# Patient Record
Sex: Male | Born: 1990 | Race: Black or African American | Hispanic: No | Marital: Single
Health system: Southern US, Community
[De-identification: ages and names within clinical notes are randomized; demographics above are authoritative.]

## PROBLEM LIST (undated history)

## (undated) DIAGNOSIS — F1910 Other psychoactive substance abuse, uncomplicated: Secondary | ICD-10-CM

## (undated) DIAGNOSIS — R454 Irritability and anger: Secondary | ICD-10-CM

---
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
## 2013-04-29 ENCOUNTER — Ambulatory Visit: Payer: Self-pay | Admitting: Internal Medicine

## 2013-04-29 ENCOUNTER — Telehealth: Payer: Self-pay | Admitting: Internal Medicine

## 2013-04-29 NOTE — Telephone Encounter (Signed)
Pt's grandmother called. They cannot find pt. They have looked everywhere and no one can locate him for this appt. Pt is depressed. Grandmother very apologetic.

## 2013-04-29 NOTE — Telephone Encounter (Signed)
FYI

## 2014-06-30 ENCOUNTER — Ambulatory Visit (INDEPENDENT_AMBULATORY_CARE_PROVIDER_SITE_OTHER): Payer: 59 | Admitting: Family Medicine

## 2014-06-30 ENCOUNTER — Ambulatory Visit (INDEPENDENT_AMBULATORY_CARE_PROVIDER_SITE_OTHER): Payer: 59

## 2014-06-30 VITALS — BP 120/70 | HR 58 | Temp 98.5°F | Resp 16 | Ht 69.0 in | Wt 123.8 lb

## 2014-06-30 DIAGNOSIS — M545 Low back pain, unspecified: Secondary | ICD-10-CM

## 2014-06-30 DIAGNOSIS — T148XXA Other injury of unspecified body region, initial encounter: Secondary | ICD-10-CM

## 2014-06-30 DIAGNOSIS — S39012A Strain of muscle, fascia and tendon of lower back, initial encounter: Secondary | ICD-10-CM

## 2014-06-30 MED ORDER — CYCLOBENZAPRINE HCL 5 MG PO TABS: 5.0000 mg | ORAL_TABLET | Freq: Three times a day (TID) | ORAL | Status: DC | PRN

## 2014-06-30 MED ORDER — IBUPROFEN 600 MG PO TABS: 600.0000 mg | ORAL_TABLET | Freq: Three times a day (TID) | ORAL | Status: DC | PRN

## 2014-06-30 NOTE — Progress Notes (Signed)
      Chief Complaint:  Chief Complaint  Patient presents with  . Back Pain    back pain for a while--lower back in the center.     HPI: Mark Harvey is a 23 y.o. male who is here for  Midline back pain x several years off and on but now in the last month working at [employer] and lifting boxes and has had worsenign back pain, No piro back injuries ran track and played football. Has no numbness or tingling.  Negative for scoliosis  History reviewed. No pertinent past medical history. History reviewed. No pertinent past surgical history. History   Social History  . Marital Status: Single    Spouse Name: N/A    Number of Children: N/A  . Years of Education: N/A   Social History Main Topics  . Smoking status: Never Smoker   . Smokeless tobacco: Never Used  . Alcohol Use: No  . Drug Use: No  . Sexual Activity: None   Other Topics Concern  . None   Social History Narrative  . None   History reviewed. No pertinent family history. Not on File Prior to Admission medications   Not on File     ROS: The patient denies fevers, chills, night sweats, unintentional weight loss, chest pain, palpitations, wheezing, dyspnea on exertion, nausea, vomiting, abdominal pain, dysuria, hematuria, melena, numbness, weakness, or tingling.   All other systems have been reviewed and were otherwise negative with the exception of those mentioned in the HPI and as above.    PHYSICAL EXAM: Filed Vitals:   06/30/14 1844  BP: 120/70  Pulse: 58  Temp: 98.5 F (36.9 C)  Resp: 16   Filed Vitals:   06/30/14 1844  Height: 5\' 9"  (1.753 m)  Weight: 123 lb 12.8 oz (56.155 kg)   Body mass index is 18.27 kg/(m^2).  General: Alert, no acute distress HEENT:  Normocephalic, atraumatic, oropharynx patent. EOMI, PERRLA Cardiovascular:  Regular rate and rhythm, no rubs murmurs or gallops.  No Carotid bruits, radial pulse intact. No pedal edema.  Respiratory: Clear to auscultation bilaterally.  No  wheezes, rales, or rhonchi.  No cyanosis, no use of accessory musculature GI: No organomegaly, abdomen is soft and non-tender, positive bowel sounds.  No masses. Skin: No rashes. Neurologic: Facial musculature symmetric. Psychiatric: Patient is appropriate throughout our interaction. Lymphatic: No cervical lymphadenopathy Musculoskeletal: Gait intact. + paramsk tenderness  Full ROM 5/5 strength, 2/2 DTRs No saddle anesthesia Straight leg negative Hip and knee exam--normal Neg scoliosis    LABS: No results found for this or any previous visit.   EKG/XRAY:   [facility] Neg for fx or dislocation   ASSESSMENT/PLAN: Encounter Diagnoses  Name Primary?  . Midline low back pain without sciatica Yes  . Sprain and strain    Back manual given , advise to lift with legs and not back He is working as a Airline pilotloader and unloader so most likely due to taht Xrays were normal except did have moderate stool, advise to take otc miralax Ibuprofen and flexeril prn   Gross sideeffects, risk and benefits, and alternatives of medications d/w patient. Patient is aware that all medications have potential sideeffects and we are unable to predict every sideeffect or drug-drug interaction that may occur.  Mark Lamping PHUONG, DO 07/01/2014 3:05 PM

## 2014-06-30 NOTE — Patient Instructions (Signed)
Back Injury Prevention Back injuries can be extremely painful and difficult to heal. After having one back injury, you are much more likely to experience another later on. It is important to learn how to avoid injuring or re-injuring your back. The following tips can help you to prevent a back injury. PHYSICAL FITNESS  Exercise regularly and try to develop good tone in your abdominal muscles. Your abdominal muscles provide a lot of the support needed by your back.  Do aerobic exercises (walking, jogging, biking, swimming) regularly.  Do exercises that increase balance and strength (tai chi, yoga) regularly. This can decrease your risk of falling and injuring your back.  Stretch before and after exercising.  Maintain a healthy weight. The more you weigh, the more stress is placed on your back. For every pound of weight, 10 times that amount of pressure is placed on the back. DIET  Talk to your caregiver about how much calcium and vitamin D you need per day. These nutrients help to prevent weakening of the bones (osteoporosis). Osteoporosis can cause broken (fractured) bones that lead to back pain.  Include good sources of calcium in your diet, such as dairy products, green, leafy vegetables, and products with calcium added (fortified).  Include good sources of vitamin D in your diet, such as milk and foods that are fortified with vitamin D.  Consider taking a nutritional supplement or a multivitamin if needed.  Stop smoking if you smoke. POSTURE  Sit and stand up straight. Avoid leaning forward when you sit or hunching over when you stand.  Choose chairs with good low back (lumbar) support.  If you work at a desk, sit close to your work so you do not need to lean over. Keep your chin tucked in. Keep your neck drawn back and elbows bent at a right angle. Your arms should look like the letter "L."  Sit high and close to the steering wheel when you drive. Add a lumbar support to your car  seat if needed.  Avoid sitting or standing in one position for too long. Take breaks to get up, stretch, and walk around at least once every hour. Take breaks if you are driving for long periods of time.  Sleep on your side with your knees slightly bent, or sleep on your back with a pillow under your knees. Do not sleep on your stomach. LIFTING, TWISTING, AND REACHING  Avoid heavy lifting, especially repetitive lifting. If you must do heavy lifting:  Stretch before lifting.  Work slowly.  Rest between lifts.  Use carts and dollies to move objects when possible.  Make several small trips instead of carrying 1 heavy load.  Ask for help when you need it.  Ask for help when moving big, awkward objects.  Follow these steps when lifting:  Stand with your feet shoulder-width apart.  Get as close to the object as you can. Do not try to pick up heavy objects that are far from your body.  Use handles or lifting straps if they are available.  Bend at your knees. Squat down, but keep your heels off the floor.  Keep your shoulders pulled back, your chin tucked in, and your back straight.  Lift the object slowly, tightening the muscles in your legs, abdomen, and buttocks. Keep the object as close to the center of your body as possible.  When you put a load down, use these same guidelines in reverse.  Do not:  Lift the object above your waist.  Twist at the waist while lifting or carrying a load. Move your feet if you need to turn, not your waist.  Bend over without bending at your knees.  Avoid reaching over your head, across a table, or for an object on a high surface. OTHER TIPS  Avoid wet floors and keep sidewalks clear of ice to prevent falls.  Do not sleep on a mattress that is too soft or too hard.  Keep items that are used frequently within easy reach.  Put heavier objects on shelves at waist level and lighter objects on lower or higher shelves.  Find ways to  decrease your stress, such as exercise, massage, or relaxation techniques. Stress can build up in your muscles. Tense muscles are more vulnerable to injury.  Seek treatment for depression or anxiety if needed. These conditions can increase your risk of developing back pain. SEEK MEDICAL CARE IF:  You injure your back.  You have questions about diet, exercise, or other ways to prevent back injuries. MAKE SURE YOU:  Understand these instructions.  Will watch your condition.  Will get help right away if you are not doing well or get worse. Document Released: 08/24/2004 Document Revised: 10/09/2011 Document Reviewed: 08/28/2011 [facility] Patient Information 2015 [place], [place]. This information is not intended to replace advice given to you by your health care provider. Make sure you discuss any questions you have with your health care provider.

## 2014-07-18 ENCOUNTER — Emergency Department (HOSPITAL_COMMUNITY)
Admission: EM | Admit: 2014-07-18 | Discharge: 2014-07-18 | Disposition: A | Discharge status: Home / Self Care | Payer: 59 | Attending: Emergency Medicine | Admitting: Emergency Medicine

## 2014-07-18 ENCOUNTER — Encounter (HOSPITAL_COMMUNITY): Payer: Self-pay | Admitting: *Deleted

## 2014-07-18 DIAGNOSIS — Y998 Other external cause status: Secondary | ICD-10-CM | POA: Diagnosis not present

## 2014-07-18 DIAGNOSIS — S0181XA Laceration without foreign body of other part of head, initial encounter: Secondary | ICD-10-CM

## 2014-07-18 DIAGNOSIS — Y9389 Activity, other specified: Secondary | ICD-10-CM | POA: Insufficient documentation

## 2014-07-18 DIAGNOSIS — Y9289 Other specified places as the place of occurrence of the external cause: Secondary | ICD-10-CM | POA: Diagnosis not present

## 2014-07-18 DIAGNOSIS — Z23 Encounter for immunization: Secondary | ICD-10-CM | POA: Insufficient documentation

## 2014-07-18 DIAGNOSIS — S01111A Laceration without foreign body of right eyelid and periocular area, initial encounter: Secondary | ICD-10-CM | POA: Diagnosis present

## 2014-07-18 DIAGNOSIS — W208XXA Other cause of strike by thrown, projected or falling object, initial encounter: Secondary | ICD-10-CM | POA: Diagnosis not present

## 2014-07-18 MED ORDER — LIDOCAINE-EPINEPHRINE 2 %-1:100000 IJ SOLN
20.0000 mL | Freq: Once | INTRAMUSCULAR | Status: AC
  Administered 2014-07-18: 1 mL
  Filled 2014-07-18: qty 1

## 2014-07-18 MED ORDER — TETANUS-DIPHTH-ACELL PERTUSSIS 5-2.5-18.5 LF-MCG/0.5 IM SUSP
0.5000 mL | Freq: Once | INTRAMUSCULAR | Status: AC
  Administered 2014-07-18: 0.5 mL via INTRAMUSCULAR
  Filled 2014-07-18: qty 0.5

## 2014-07-18 NOTE — ED Notes (Signed)
Wound cleaned and antibiotic applied.  Post instructions and care given.

## 2014-07-18 NOTE — ED Notes (Signed)
Bed: WTR6 Expected date:  Expected time:  Means of arrival:  Comments: 

## 2014-07-18 NOTE — ED Notes (Signed)
Acuity changed.  Pt needed meds and sutures with post instructions.

## 2014-07-18 NOTE — Discharge Instructions (Signed)
Read the information below.  You may return to the Emergency Department at any time for worsening condition or any new symptoms that concern you.  If you develop redness, swelling, pus draining from the wound, or fevers greater than 100.4, return to the ER immediately for a recheck.     Facial Laceration  A facial laceration is a cut on the face. These injuries can be painful and cause bleeding. Lacerations usually heal quickly, but they need special care to reduce scarring. DIAGNOSIS  Your health care provider will take a medical history, ask for details about how the injury occurred, and examine the wound to determine how deep the cut is. TREATMENT  Some facial lacerations may not require closure. Others may not be able to be closed because of an increased risk of infection. The risk of infection and the chance for successful closure will depend on various factors, including the amount of time since the injury occurred. The wound may be cleaned to help prevent infection. If closure is appropriate, pain medicines may be given if needed. Your health care provider will use stitches (sutures), wound glue (adhesive), or skin adhesive strips to repair the laceration. These tools bring the skin edges together to allow for faster healing and a better cosmetic outcome. If needed, you may also be given a tetanus shot. HOME CARE INSTRUCTIONS  Only take over-the-counter or prescription medicines as directed by your health care provider.  Follow your health care provider's instructions for wound care. These instructions will vary depending on the technique used for closing the wound. For Sutures:  Keep the wound clean and dry.   If you were given a bandage (dressing), you should change it at least once a day. Also change the dressing if it becomes wet or dirty, or as directed by your health care provider.   Wash the wound with soap and water 2 times a day. Rinse the wound off with water to remove all  soap. Pat the wound dry with a clean towel.   After cleaning, apply a thin layer of the antibiotic ointment recommended by your health care provider. This will help prevent infection and keep the dressing from sticking.   You may shower as usual after the first 24 hours. Do not soak the wound in water until the sutures are removed.   Get your sutures removed as directed by your health care provider. With facial lacerations, sutures should usually be taken out after 4-5 days to avoid stitch marks.   Wait a few days after your sutures are removed before applying any makeup. For Skin Adhesive Strips:  Keep the wound clean and dry.   Do not get the skin adhesive strips wet. You may bathe carefully, using caution to keep the wound dry.   If the wound gets wet, pat it dry with a clean towel.   Skin adhesive strips will fall off on their own. You may trim the strips as the wound heals. Do not remove skin adhesive strips that are still stuck to the wound. They will fall off in time.  For Wound Adhesive:  You may briefly wet your wound in the shower or bath. Do not soak or scrub the wound. Do not swim. Avoid periods of heavy sweating until the skin adhesive has fallen off on its own. After showering or bathing, gently pat the wound dry with a clean towel.   Do not apply liquid medicine, cream medicine, ointment medicine, or makeup to your wound while the  skin adhesive is in place. This may loosen the film before your wound is healed.   If a dressing is placed over the wound, be careful not to apply tape directly over the skin adhesive. This may cause the adhesive to be pulled off before the wound is healed.   Avoid prolonged exposure to sunlight or tanning lamps while the skin adhesive is in place.  The skin adhesive will usually remain in place for 5-10 days, then naturally fall off the skin. Do not pick at the adhesive film.  After Healing: Once the wound has healed, cover the  wound with sunscreen during the day for 1 full year. This can help minimize scarring. Exposure to ultraviolet light in the first year will darken the scar. It can take 1-2 years for the scar to lose its redness and to heal completely.  SEEK IMMEDIATE MEDICAL CARE IF:  You have redness, pain, or swelling around the wound.   You see ayellowish-white fluid (pus) coming from the wound.   You have chills or a fever.  MAKE SURE YOU:  Understand these instructions.  Will watch your condition.  Will get help right away if you are not doing well or get worse. Document Released: 08/24/2004 Document Revised: 05/07/2013 Document Reviewed: 02/27/2013 [facility] Patient Information 2015 [place]. This information is not intended to replace advice given to you by your health care provider. Make sure you discuss any questions you have with your health care provider.

## 2014-07-18 NOTE — ED Notes (Signed)
Tetanus education given

## 2014-07-18 NOTE — ED Notes (Signed)
Pt reports pulling attic door down and the door came down too quickly and hit him in the R eyebrow. Bleeding controlled. Dressing applied at home, unable to assess laceration in triage.

## 2014-07-18 NOTE — ED Provider Notes (Addendum)
6:22 PM Patient seen by Dr Adriana Simasook.  I was asked to repair facial laceration with steri strips vs sutures.  The laceration occurred around 4am when patient pulled the attic door down and boxes hit him in the face.  Laceration is approximately 2cm.  I have discussed the pros and cons of sutures vs steri strips with patient and family member and they have opted for sutures.    LACERATION REPAIR Performed by: Donnetta HutchingOOK,Bret Vanessen Authorized byDonnetta Hutching: Beverlee Wilmarth Consent: Verbal consent obtained. Risks and benefits: risks, benefits and alternatives were discussed Consent given by: patient Patient identity confirmed: provided demographic data Prepped and Draped in normal sterile fashion Wound explored  Laceration Location: right face/eyebrow  Laceration Length: 2cm  No Foreign Bodies seen or palpated  Anesthesia: local infiltration  Local anesthetic: lidocaine 2% with epinephrine  Anesthetic total: 1.5 ml  Irrigation method: syringe Amount of cleaning: standard  Skin closure: 5-0 plain gut  Number of sutures: 3  Technique: simple interrupted  Patient tolerance: Patient tolerated the procedure well with no immediate complications.   Discussed findings, treatment, and follow up  with patient.  Pt given return precautions.  Pt verbalizes understanding and agrees with plan.      Trixie Dredgemily West, PA-C 07/18/14 1104  Donnetta HutchingBrian Ranard Harte, MD 07/18/14 1221   Medical screening examination/treatment/procedure(s) were conducted as a shared visit with non-physician practitioner(s) and myself.  I personally evaluated the patient during the encounter.   EKG Interpretation None     2 cm laceration to right face/eyebrow. No neuro deficits. Repair by physician assistant.  Donnetta HutchingBrian Keirsten Matuska, MD 07/28/14 0131   Medical screening examination/treatment/procedure(s) were conducted as a shared visit with non-physician practitioner(s) and myself.  I personally evaluated the patient during the encounter.   EKG  Interpretation None       Donnetta HutchingBrian Amire Leazer, MD 07/28/14 2332  Donnetta HutchingBrian Quadry Kampa, MD 07/29/14 Rickey [id number]

## 2014-07-29 NOTE — ED Provider Notes (Signed)
Chief complaint right eyebrow laceration  History of present illness:  Laceration on right lateral eyebrow after box fell from attic. No loss of consciousness or neurological deficits. No other injuries. No neck pain. Severity is mild.  Past medical history: No chronic medical conditions. No meds or allergies.  Review of systems: All other systems reviewed and negative.  No neuro deficits, extremity pain.  Physical exam: Vital signs reviewed Constitutional:  Well-appearing HEENT:  2.0 cm laceration right lateral eyebrow Neck: Supple Lung: Clear bilaterally Cardiovascular: Heart normal sinus rhythm Abdomen: Soft nontender Skin: Laceration as above. Neuro: Normal motor, sensory, cerebellar Psych:  Normal mood and behavior  Impression: Laceration right eyebrow 2.0 cm  Plan: Surgical repair by physician assistant.  Donnetta HutchingBrian Tonatiuh Mallon, MD 07/29/14 [phone]

## 2014-08-18 NOTE — ED Provider Notes (Signed)
CSN: [id number]     Arrival date & time 07/18/14  [id number] History   First MD Initiated Contact with Patient 07/18/14 [phone]     Chief Complaint  Patient presents with  . Head Laceration     (Consider location/radiation/quality/duration/timing/severity/associated sxs/prior Treatment) HPI..... 2 cm laceration on right eyebrow after a box fell from attic. No other head trauma. No neck trauma. No loss of consciousness or neurological deficits. No other injuries. Severity is mild.  History reviewed. No pertinent past medical history. History reviewed. No pertinent past surgical history. No family history on file. History  Substance Use Topics  . Smoking status: Never Smoker   . Smokeless tobacco: Never Used  . Alcohol Use: No    Review of Systems  All other systems reviewed and are negative.     Allergies  Review of patient's allergies indicates no known allergies.  Home Medications   Prior to Admission medications   Medication Sig Start Date End Date Taking? Authorizing Provider  cyclobenzaprine (FLEXERIL) 5 MG tablet Take 1 tablet (5 mg total) by mouth 3 (three) times daily as needed for muscle spasms. 06/30/14   Thao P Le, DO  ibuprofen (ADVIL,MOTRIN) 600 MG tablet Take 1 tablet (600 mg total) by mouth every 8 (eight) hours as needed. Take with foo, no other NSAIDs. 06/30/14   Thao P Le, DO   BP 129/76 mmHg  Pulse 52  Temp(Src) 97.6 F (36.4 C) (Oral)  Resp 17  SpO2 100% Physical Exam  Constitutional: He is oriented to person, place, and time. He appears well-developed and well-nourished.  HENT:  Head: Normocephalic.  2.0 cm laceration to right lateral eyebrow  Eyes: Conjunctivae and EOM are normal. Pupils are equal, round, and reactive to light.  Neck: Normal range of motion. Neck supple.  Cardiovascular: Normal rate and regular rhythm.   Pulmonary/Chest: Effort normal and breath sounds normal.  Abdominal: Soft. Bowel sounds are normal.  Musculoskeletal: Normal range of  motion.  Neurological: He is alert and oriented to person, place, and time.  Skin: Skin is warm and dry.  Psychiatric: He has a normal mood and affect. His behavior is normal.  Nursing note and vitals reviewed.   ED Course  Procedures (including critical care time) Labs Review Labs Reviewed - No data to display  Imaging Review No results found.   EKG Interpretation None      MDM   Final diagnoses:  Facial laceration, initial encounter    Laceration repair by Physician asst.   No neurological deficits.    Donnetta HutchingBrian Devon Kingdon, MD 08/18/14 2150

## 2015-04-26 ENCOUNTER — Other Ambulatory Visit (HOSPITAL_COMMUNITY): Payer: Self-pay | Admitting: Orthopedic Surgery

## 2015-04-26 DIAGNOSIS — M5440 Lumbago with sciatica, unspecified side: Secondary | ICD-10-CM

## 2015-04-28 ENCOUNTER — Ambulatory Visit (HOSPITAL_COMMUNITY): Admission: RE | Admit: 2015-04-28 | Payer: 59 | Source: Ambulatory Visit

## 2015-05-03 ENCOUNTER — Ambulatory Visit (HOSPITAL_COMMUNITY): Admission: RE | Admit: 2015-05-03 | Payer: 59 | Source: Ambulatory Visit

## 2015-05-17 ENCOUNTER — Ambulatory Visit (HOSPITAL_COMMUNITY): Admission: RE | Admit: 2015-05-17 | Payer: 59 | Source: Ambulatory Visit

## 2015-05-26 ENCOUNTER — Ambulatory Visit (HOSPITAL_COMMUNITY): Admission: RE | Admit: 2015-05-26 | Payer: 59 | Source: Ambulatory Visit

## 2015-12-07 ENCOUNTER — Emergency Department (HOSPITAL_COMMUNITY)
Admission: EM | Admit: 2015-12-07 | Discharge: 2015-12-07 | Disposition: A | Discharge status: Home / Self Care | Payer: 59 | Attending: Emergency Medicine | Admitting: Emergency Medicine

## 2015-12-07 ENCOUNTER — Encounter (HOSPITAL_COMMUNITY): Payer: Self-pay | Admitting: Emergency Medicine

## 2015-12-07 DIAGNOSIS — S39012A Strain of muscle, fascia and tendon of lower back, initial encounter: Secondary | ICD-10-CM | POA: Insufficient documentation

## 2015-12-07 DIAGNOSIS — Y939 Activity, unspecified: Secondary | ICD-10-CM | POA: Diagnosis not present

## 2015-12-07 DIAGNOSIS — Y990 Civilian activity done for income or pay: Secondary | ICD-10-CM | POA: Diagnosis not present

## 2015-12-07 DIAGNOSIS — X509XXA Other and unspecified overexertion or strenuous movements or postures, initial encounter: Secondary | ICD-10-CM | POA: Diagnosis not present

## 2015-12-07 DIAGNOSIS — Y929 Unspecified place or not applicable: Secondary | ICD-10-CM | POA: Diagnosis not present

## 2015-12-07 DIAGNOSIS — Z79899 Other long term (current) drug therapy: Secondary | ICD-10-CM | POA: Insufficient documentation

## 2015-12-07 DIAGNOSIS — M5441 Lumbago with sciatica, right side: Secondary | ICD-10-CM | POA: Diagnosis not present

## 2015-12-07 DIAGNOSIS — M545 Low back pain: Secondary | ICD-10-CM | POA: Diagnosis present

## 2015-12-07 DIAGNOSIS — Z791 Long term (current) use of non-steroidal anti-inflammatories (NSAID): Secondary | ICD-10-CM | POA: Diagnosis not present

## 2015-12-07 DIAGNOSIS — M5431 Sciatica, right side: Secondary | ICD-10-CM

## 2015-12-07 MED ORDER — CYCLOBENZAPRINE HCL 10 MG PO TABS: 10.0000 mg | ORAL_TABLET | Freq: Two times a day (BID) | ORAL | Status: DC | PRN

## 2015-12-07 NOTE — Discharge Instructions (Signed)
Sciatica With Rehab The sciatic nerve runs from the back down the leg and is responsible for sensation and control of the muscles in the back (posterior) side of the thigh, lower leg, and foot. Sciatica is a condition that is characterized by inflammation of this nerve.  SYMPTOMS   Signs of nerve damage, including numbness and/or weakness along the posterior side of the lower extremity.  Pain in the back of the thigh that may also travel down the leg.  Pain that worsens when sitting for long periods of time.  Occasionally, pain in the back or buttock. CAUSES  Inflammation of the sciatic nerve is the cause of sciatica. The inflammation is due to something irritating the nerve. Common sources of irritation include:  Sitting for long periods of time.  Direct trauma to the nerve.  Arthritis of the spine.  Herniated or ruptured disk.  Slipping of the vertebrae (spondylolisthesis).  Pressure from soft tissues, such as muscles or ligament-like tissue (fascia). RISK INCREASES WITH:  Sports that place pressure or stress on the spine (football or weightlifting).  Poor strength and flexibility.  Failure to warm up properly before activity.  Family history of low back pain or disk disorders.  Previous back injury or surgery.  Poor body mechanics, especially when lifting, or poor posture. PREVENTION   Warm up and stretch properly before activity.  Maintain physical fitness:  Strength, flexibility, and endurance.  Cardiovascular fitness.  Learn and use proper technique, especially with posture and lifting. When possible, have coach correct improper technique.  Avoid activities that place stress on the spine. PROGNOSIS If treated properly, then sciatica usually resolves within 6 weeks. However, occasionally surgery is necessary.  RELATED COMPLICATIONS   Permanent nerve damage, including pain, numbness, tingle, or weakness.  Chronic back pain.  Risks of surgery: infection,  bleeding, nerve damage, or damage to surrounding tissues. TREATMENT Treatment initially involves resting from any activities that aggravate your symptoms. The use of ice and medication may help reduce pain and inflammation. The use of strengthening and stretching exercises may help reduce pain with activity. These exercises may be performed at home or with referral to a therapist. A therapist may recommend further treatments, such as transcutaneous electronic nerve stimulation (TENS) or ultrasound. Your caregiver may recommend corticosteroid injections to help reduce inflammation of the sciatic nerve. If symptoms persist despite non-surgical (conservative) treatment, then surgery may be recommended. MEDICATION  If pain medication is necessary, then nonsteroidal anti-inflammatory medications, such as aspirin and ibuprofen, or other minor pain relievers, such as acetaminophen, are often recommended.  Do not take pain medication for 7 days before surgery.  Prescription pain relievers may be given if deemed necessary by your caregiver. Use only as directed and only as much as you need.  Ointments applied to the skin may be helpful.  Corticosteroid injections may be given by your caregiver. These injections should be reserved for the most serious cases, because they may only be given a certain number of times. HEAT AND COLD  Cold treatment (icing) relieves pain and reduces inflammation. Cold treatment should be applied for 10 to 15 minutes every 2 to 3 hours for inflammation and pain and immediately after any activity that aggravates your symptoms. Use ice packs or massage the area with a piece of ice (ice massage).  Heat treatment may be used prior to performing the stretching and strengthening activities prescribed by your caregiver, physical therapist, or athletic trainer. Use a heat pack or soak the injury in warm water.   SEEK MEDICAL CARE IF:  Treatment seems to offer no benefit, or the condition  worsens.  Any medications produce adverse side effects. EXERCISES  RANGE OF MOTION (ROM) AND STRETCHING EXERCISES - Sciatica Most people with sciatic will find that their symptoms worsen with either excessive bending forward (flexion) or arching at the low back (extension). The exercises which will help resolve your symptoms will focus on the opposite motion. Your physician, physical therapist or athletic trainer will help you determine which exercises will be most helpful to resolve your low back pain. Do not complete any exercises without first consulting with your clinician. Discontinue any exercises which worsen your symptoms until you speak to your clinician. If you have pain, numbness or tingling which travels down into your buttocks, leg or foot, the goal of the therapy is for these symptoms to move closer to your back and eventually resolve. Occasionally, these leg symptoms will get better, but your low back pain may worsen; this is typically an indication of progress in your rehabilitation. Be certain to be very alert to any changes in your symptoms and the activities in which you participated in the 24 hours prior to the change. Sharing this information with your clinician will allow him/her to most efficiently treat your condition. These exercises may help you when beginning to rehabilitate your injury. Your symptoms may resolve with or without further involvement from your physician, physical therapist or athletic trainer. While completing these exercises, remember:   Restoring tissue flexibility helps normal motion to return to the joints. This allows healthier, less painful movement and activity.  An effective stretch should be held for at least 30 seconds.  A stretch should never be painful. You should only feel a gentle lengthening or release in the stretched tissue. FLEXION RANGE OF MOTION AND STRETCHING EXERCISES: STRETCH - Flexion, Single Knee to Chest   Lie on a firm bed or floor  with both legs extended in front of you.  Keeping one leg in contact with the floor, bring your opposite knee to your chest. Hold your leg in place by either grabbing behind your thigh or at your knee.  Pull until you feel a gentle stretch in your low back. Hold __________ seconds.  Slowly release your grasp and repeat the exercise with the opposite side. Repeat __________ times. Complete this exercise __________ times per day.  STRETCH - Flexion, Double Knee to Chest  Lie on a firm bed or floor with both legs extended in front of you.  Keeping one leg in contact with the floor, bring your opposite knee to your chest.  Tense your stomach muscles to support your back and then lift your other knee to your chest. Hold your legs in place by either grabbing behind your thighs or at your knees.  Pull both knees toward your chest until you feel a gentle stretch in your low back. Hold __________ seconds.  Tense your stomach muscles and slowly return one leg at a time to the floor. Repeat __________ times. Complete this exercise __________ times per day.  STRETCH - Low Trunk Rotation   Lie on a firm bed or floor. Keeping your legs in front of you, bend your knees so they are both pointed toward the ceiling and your feet are flat on the floor.  Extend your arms out to the side. This will stabilize your upper body by keeping your shoulders in contact with the floor.  Gently and slowly drop both knees together to one side until   you feel a gentle stretch in your low back. Hold for __________ seconds.  Tense your stomach muscles to support your low back as you bring your knees back to the starting position. Repeat the exercise to the other side. Repeat __________ times. Complete this exercise __________ times per day  EXTENSION RANGE OF MOTION AND FLEXIBILITY EXERCISES: STRETCH - Extension, Prone on Elbows  Lie on your stomach on the floor, a bed will be too soft. Place your palms about shoulder  width apart and at the height of your head.  Place your elbows under your shoulders. If this is too painful, stack pillows under your chest.  Allow your body to relax so that your hips drop lower and make contact more completely with the floor.  Hold this position for __________ seconds.  Slowly return to lying flat on the floor. Repeat __________ times. Complete this exercise __________ times per day.  RANGE OF MOTION - Extension, Prone Press Ups  Lie on your stomach on the floor, a bed will be too soft. Place your palms about shoulder width apart and at the height of your head.  Keeping your back as relaxed as possible, slowly straighten your elbows while keeping your hips on the floor. You may adjust the placement of your hands to maximize your comfort. As you gain motion, your hands will come more underneath your shoulders.  Hold this position __________ seconds.  Slowly return to lying flat on the floor. Repeat __________ times. Complete this exercise __________ times per day.  STRENGTHENING EXERCISES - Sciatica  These exercises may help you when beginning to rehabilitate your injury. These exercises should be done near your "sweet spot." This is the neutral, low-back arch, somewhere between fully rounded and fully arched, that is your least painful position. When performed in this safe range of motion, these exercises can be used for people who have either a flexion or extension based injury. These exercises may resolve your symptoms with or without further involvement from your physician, physical therapist or athletic trainer. While completing these exercises, remember:   Muscles can gain both the endurance and the strength needed for everyday activities through controlled exercises.  Complete these exercises as instructed by your physician, physical therapist or athletic trainer. Progress with the resistance and repetition exercises only as your caregiver advises.  You may  experience muscle soreness or fatigue, but the pain or discomfort you are trying to eliminate should never worsen during these exercises. If this pain does worsen, stop and make certain you are following the directions exactly. If the pain is still present after adjustments, discontinue the exercise until you can discuss the trouble with your clinician. STRENGTHENING - Deep Abdominals, Pelvic Tilt   Lie on a firm bed or floor. Keeping your legs in front of you, bend your knees so they are both pointed toward the ceiling and your feet are flat on the floor.  Tense your lower abdominal muscles to press your low back into the floor. This motion will rotate your pelvis so that your tail bone is scooping upwards rather than pointing at your feet or into the floor.  With a gentle tension and even breathing, hold this position for __________ seconds. Repeat __________ times. Complete this exercise __________ times per day.  STRENGTHENING - Abdominals, Crunches   Lie on a firm bed or floor. Keeping your legs in front of you, bend your knees so they are both pointed toward the ceiling and your feet are flat on the   floor. Cross your arms over your chest.  Slightly tip your chin down without bending your neck.  Tense your abdominals and slowly lift your trunk high enough to just clear your shoulder blades. Lifting higher can put excessive stress on the low back and does not further strengthen your abdominal muscles.  Control your return to the starting position. Repeat __________ times. Complete this exercise __________ times per day.  STRENGTHENING - Quadruped, Opposite UE/LE Lift  Assume a hands and knees position on a firm surface. Keep your hands under your shoulders and your knees under your hips. You may place padding under your knees for comfort.  Find your neutral spine and gently tense your abdominal muscles so that you can maintain this position. Your shoulders and hips should form a rectangle  that is parallel with the floor and is not twisted.  Keeping your trunk steady, lift your right hand no higher than your shoulder and then your left leg no higher than your hip. Make sure you are not holding your breath. Hold this position __________ seconds.  Continuing to keep your abdominal muscles tense and your back steady, slowly return to your starting position. Repeat with the opposite arm and leg. Repeat __________ times. Complete this exercise __________ times per day.  STRENGTHENING - Abdominals and Quadriceps, Straight Leg Raise   Lie on a firm bed or floor with both legs extended in front of you.  Keeping one leg in contact with the floor, bend the other knee so that your foot can rest flat on the floor.  Find your neutral spine, and tense your abdominal muscles to maintain your spinal position throughout the exercise.  Slowly lift your straight leg off the floor about 6 inches for a count of 15, making sure to not hold your breath.  Still keeping your neutral spine, slowly lower your leg all the way to the floor. Repeat this exercise with each leg __________ times. Complete this exercise __________ times per day. POSTURE AND BODY MECHANICS CONSIDERATIONS - Sciatica Keeping correct posture when sitting, standing or completing your activities will reduce the stress put on different body tissues, allowing injured tissues a chance to heal and limiting painful experiences. The following are general guidelines for improved posture. Your physician or physical therapist will provide you with any instructions specific to your needs. While reading these guidelines, remember:  The exercises prescribed by your provider will help you have the flexibility and strength to maintain correct postures.  The correct posture provides the optimal environment for your joints to work. All of your joints have less wear and tear when properly supported by a spine with good posture. This means you will  experience a healthier, less painful body.  Correct posture must be practiced with all of your activities, especially prolonged sitting and standing. Correct posture is as important when doing repetitive low-stress activities (typing) as it is when doing a single heavy-load activity (lifting). RESTING POSITIONS Consider which positions are most painful for you when choosing a resting position. If you have pain with flexion-based activities (sitting, bending, stooping, squatting), choose a position that allows you to rest in a less flexed posture. You would want to avoid curling into a fetal position on your side. If your pain worsens with extension-based activities (prolonged standing, working overhead), avoid resting in an extended position such as sleeping on your stomach. Most people will find more comfort when they rest with their spine in a more neutral position, neither too rounded nor too   arched. Lying on a non-sagging bed on your side with a pillow between your knees, or on your back with a pillow under your knees will often provide some relief. Keep in mind, being in any one position for a prolonged period of time, no matter how correct your posture, can still lead to stiffness. PROPER SITTING POSTURE In order to minimize stress and discomfort on your spine, you must sit with correct posture Sitting with good posture should be effortless for a healthy body. Returning to good posture is a gradual process. Many people can work toward this most comfortably by using various supports until they have the flexibility and strength to maintain this posture on their own. When sitting with proper posture, your ears will fall over your shoulders and your shoulders will fall over your hips. You should use the back of the chair to support your upper back. Your low back will be in a neutral position, just slightly arched. You may place a small pillow or folded towel at the base of your low back for support.  When  working at a desk, create an environment that supports good, upright posture. Without extra support, muscles fatigue and lead to excessive strain on joints and other tissues. Keep these recommendations in mind: CHAIR:   A chair should be able to slide under your desk when your back makes contact with the back of the chair. This allows you to work closely.  The chair's height should allow your eyes to be level with the upper part of your monitor and your hands to be slightly lower than your elbows. BODY POSITION  Your feet should make contact with the floor. If this is not possible, use a foot rest.  Keep your ears over your shoulders. This will reduce stress on your neck and low back. INCORRECT SITTING POSTURES   If you are feeling tired and unable to assume a healthy sitting posture, do not slouch or slump. This puts excessive strain on your back tissues, causing more damage and pain. Healthier options include:  Using more support, like a lumbar pillow.  Switching tasks to something that requires you to be upright or walking.  Talking a brief walk.  Lying down to rest in a neutral-spine position. PROLONGED STANDING WHILE SLIGHTLY LEANING FORWARD  When completing a task that requires you to lean forward while standing in one place for a long time, place either foot up on a stationary 2-4 inch high object to help maintain the best posture. When both feet are on the ground, the low back tends to lose its slight inward curve. If this curve flattens (or becomes too large), then the back and your other joints will experience too much stress, fatigue more quickly and can cause pain.  CORRECT STANDING POSTURES Proper standing posture should be assumed with all daily activities, even if they only take a few moments, like when brushing your teeth. As in sitting, your ears should fall over your shoulders and your shoulders should fall over your hips. You should keep a slight tension in your abdominal  muscles to brace your spine. Your tailbone should point down to the ground, not behind your body, resulting in an over-extended swayback posture.  INCORRECT STANDING POSTURES  Common incorrect standing postures include a forward head, locked knees and/or an excessive swayback. WALKING Walk with an upright posture. Your ears, shoulders and hips should all line-up. PROLONGED ACTIVITY IN A FLEXED POSITION When completing a task that requires you to bend forward   at your waist or lean over a low surface, try to find a way to stabilize 3 of 4 of your limbs. You can place a hand or elbow on your thigh or rest a knee on the surface you are reaching across. This will provide you more stability so that your muscles do not fatigue as quickly. By keeping your knees relaxed, or slightly bent, you will also reduce stress across your low back. CORRECT LIFTING TECHNIQUES DO :   Assume a wide stance. This will provide you more stability and the opportunity to get as close as possible to the object which you are lifting.  Tense your abdominals to brace your spine; then bend at the knees and hips. Keeping your back locked in a neutral-spine position, lift using your leg muscles. Lift with your legs, keeping your back straight.  Test the weight of unknown objects before attempting to lift them.  Try to keep your elbows locked down at your sides in order get the best strength from your shoulders when carrying an object.  Always ask for help when lifting heavy or awkward objects. INCORRECT LIFTING TECHNIQUES DO NOT:   Lock your knees when lifting, even if it is a small object.  Bend and twist. Pivot at your feet or move your feet when needing to change directions.  Assume that you cannot safely pick up a paperclip without proper posture.   This information is not intended to replace advice given to you by your health care provider. Make sure you discuss any questions you have with your health care provider.     Document Released: 07/17/2005 Document Revised: 12/01/2014 Document Reviewed: 10/29/2008 [employer] Interactive Patient Education 2016 [employer]  

## 2015-12-07 NOTE — ED Notes (Signed)
Pt c/o exacerbation onset today of chronic back pain onset more than 1 year ago. Ambulatory. No bowel or bladder incontinence.

## 2015-12-07 NOTE — ED Provider Notes (Signed)
CSN: [id number]     Arrival date & time 12/07/15  1745 History  By signing my name below, I, Soijett Blue, attest that this documentation has been prepared under the direction and in the presence of Roxy Horsemanobert Geddy Boydstun, PA-C Electronically Signed: Soijett Blue, ED Scribe. 12/07/2015. 6:29 PM.   Chief Complaint  Patient presents with  . Back Pain      The history is provided by the patient. No language interpreter was used.    HPI Comments: Mark Harvey is a 25 y.o. male who presents to the Emergency Department complaining of chronic back pain exacerbation onset today. Pt notes that he has had lower back pain for several years. Pt states that this morning, he was having difficulty with getting up due to his lower back pain. Pt reports that he works at [employer] and he worked hard last night. Pt notes that his lower back pain is worsened with extended periods of standing or working excessively. He states that his lower back pain radiates down his right upper leg. He states that he has not tried any medications for the relief for his symptoms. Pt denies bowel/bladder incontinence, numbness, tingling, gait problem, and any other symptoms. Denies PMHx of CA or IV drug use. Pt mother is going to set up an appointment with [facility] and would like a referral.   History reviewed. No pertinent past medical history. History reviewed. No pertinent past surgical history. History reviewed. No pertinent family history. Social History  Substance Use Topics  . Smoking status: Never Smoker   . Smokeless tobacco: Never Used  . Alcohol Use: No    Review of Systems  Gastrointestinal:       No bowel incontinence  Genitourinary:       No bladder incontinence  Musculoskeletal: Positive for back pain. Negative for gait problem.  Neurological: Negative for numbness.       No tingling  All other systems reviewed and are negative.    Allergies  Review of patient's allergies indicates no known  allergies.  Home Medications   Prior to Admission medications   Medication Sig Start Date End Date Taking? Authorizing Provider  cyclobenzaprine (FLEXERIL) 5 MG tablet Take 1 tablet (5 mg total) by mouth 3 (three) times daily as needed for muscle spasms. 06/30/14   Thao P Le, DO  ibuprofen (ADVIL,MOTRIN) 600 MG tablet Take 1 tablet (600 mg total) by mouth every 8 (eight) hours as needed. Take with foo, no other NSAIDs. 06/30/14   Thao P Le, DO   BP 127/69 mmHg  Pulse 76  Temp(Src) 99 F (37.2 C) (Oral)  Resp 16  SpO2 100% Physical Exam  Physical Exam  Constitutional: Pt appears well-developed and well-nourished. No distress.  HENT:  Head: Normocephalic and atraumatic.  Mouth/Throat: Oropharynx is clear and moist. No oropharyngeal exudate.  Eyes: Conjunctivae are normal.  Neck: Normal range of motion. Neck supple.  No meningismus Cardiovascular: Normal rate, regular rhythm and intact distal pulses.   Pulmonary/Chest: Effort normal and breath sounds normal. No respiratory distress. Pt has no wheezes.  Abdominal: Pt exhibits no distension Musculoskeletal:  Lumbar paraspinal tender to palpation, no bony CTLS spine tenderness, deformity, step-off, or crepitus Lymphadenopathy: Pt has no cervical adenopathy.  Neurological: Pt is alert and oriented Speech is clear and goal oriented, follows commands Normal 5/5 strength in upper and lower extremities bilaterally including dorsiflexion and plantar flexion, strong and equal grip strength Sensation intact Great toe extension intact Moves extremities without ataxia, coordination intact Ankle  and knee jerk reflexes intact and symmetrical  Normal gait Normal balance No Clonus Skin: Skin is warm and dry. No rash noted. Pt is not diaphoretic. No erythema.  Psychiatric: Pt has a normal mood and affect. Behavior is normal.  Nursing note and vitals reviewed.   ED Course  Procedures (including critical care time) DIAGNOSTIC STUDIES: Oxygen  Saturation is 100% on RA, nl by my interpretation.    COORDINATION OF CARE: 6:11 PM Discussed treatment plan with pt at bedside which includes muscle relaxer Rx and pt agreed to plan.     MDM   Final diagnoses:  Lumbar strain, initial encounter  Sciatica of right side    Patient with back pain. No neurological deficits and normal neuro exam.  Patient is ambulatory.  No loss of bowel or bladder control.  No concern for cauda equina.  No fever, night sweats, weight loss, h/o cancer, IVDA, no recent procedure to back. No urinary symptoms suggestive of UTI. Pt will be [facility] for follow up PRN. Pt will be discharged home with flexeril. Supportive care and return precaution discussed. Appears safe for discharge at this time. Follow up as indicated in discharge paperwork.   I personally performed the services described in this documentation, which was scribed in my presence. The recorded information has been reviewed and is accurate.      Roxy Horseman, PA-C 12/07/15 1843  Pricilla Loveless, MD 12/08/15 [phone]

## 2016-07-19 ENCOUNTER — Encounter (HOSPITAL_COMMUNITY): Payer: Self-pay | Admitting: Emergency Medicine

## 2016-07-19 ENCOUNTER — Emergency Department (HOSPITAL_COMMUNITY)
Admission: EM | Admit: 2016-07-19 | Discharge: 2016-07-19 | Disposition: A | Discharge status: Home / Self Care | Payer: 59 | Attending: Emergency Medicine | Admitting: Emergency Medicine

## 2016-07-19 DIAGNOSIS — Z79899 Other long term (current) drug therapy: Secondary | ICD-10-CM | POA: Diagnosis not present

## 2016-07-19 DIAGNOSIS — M545 Low back pain, unspecified: Secondary | ICD-10-CM

## 2016-07-19 MED ORDER — METHOCARBAMOL 500 MG PO TABS
500.0000 mg | ORAL_TABLET | Freq: Two times a day (BID) | ORAL | 0 refills | Status: DC
Start: 2016-07-19 — End: 2017-04-01

## 2016-07-19 MED ORDER — NAPROXEN 500 MG PO TABS: 500.0000 mg | ORAL_TABLET | Freq: Two times a day (BID) | ORAL | 0 refills | Status: DC

## 2016-07-19 NOTE — ED Triage Notes (Signed)
Pt reports 3 day hx of low back pain. Increased pain with activity. Hx of intermittent back pain x 2 years.

## 2016-07-19 NOTE — ED Triage Notes (Signed)
Pt c/o lower back pain for past 3 days. Has tried ibuprofen with no relief. Pt ambulatory.

## 2016-07-19 NOTE — Discharge Instructions (Signed)
Take your medications as prescribed as needed for pain relief. He may also apply ice and/or heat to affected area for 15-20 minutes 3-4 times daily for additional relief. I recommend refrain from doing any heavy lifting or repetitive movements that exacerbate her symptoms for the next few days. Follow-up with your primary care provider if your symptoms have not improved in the next week. Please return to the Emergency Department if symptoms worsen or new onset of fever, numbness, tingling, groin anesthesia, loss of bowel or bladder, weakness.

## 2016-07-19 NOTE — ED Provider Notes (Signed)
[facility]-EMERGENCY DEPT Provider Note   CSN: [id number] Arrival date & time: 07/19/16  1455  By signing my name below, I, Teofilo PodMatthew P. Jamison, attest that this documentation has been prepared under the direction and in the presence of Melburn HakeNicole Jocabed Cheese, [place]-C. Electronically Signed: Teofilo PodMatthew P. Jamison, ED Scribe. 07/19/2016. 3:37 PM.   History   Chief Complaint Chief Complaint  Patient presents with  . Back Pain   The history is provided by the patient. No language interpreter was used.   HPI Comments:  Mark Sensordrian Hebner is a 25 y.o. male who presents to the Emergency Department complaining of chronic back pain for 2 years. Pt states that he first started having back pain when he worked for [employer], and now the back pain has been worsening over the past 3 days. Denies any recent fall, trauma or injury to attribute to this episode of back pain. Pt notes that the pain is worsened when sitting down and bending, and that the pain radiates to his right leg with numbnessand left upper back when he sits down. Pt states that the pain is alleviated saying on his side with a pillow. \Pt was seen here for the same several months ago. Pt has been to the chiropractor 1 year ago and had an x-ray at that time. Pt has taken ibuprofen with no relief. Pt denies injury/trauma, and denies IV drug use Pt denies fever, chest pain, SOB, nausea, vomiting, abdominal pain, bowel/bladder incontinence.    History reviewed. No pertinent past medical history.  There are no active problems to display for this patient.   History reviewed. No pertinent surgical history.     Home Medications    Prior to Admission medications   Medication Sig Start Date End Date Taking? Authorizing Provider  cyclobenzaprine (FLEXERIL) 10 MG tablet Take 1 tablet (10 mg total) by mouth 2 (two) times daily as needed for muscle spasms. 12/07/15   Roxy Horsemanobert Browning, PA-C  methocarbamol (ROBAXIN) 500 MG tablet Take 1 tablet (500 mg total) by mouth 2  (two) times daily. 07/19/16   Barrett HenleNicole Elizabeth Desteni Piscopo, PA-C  naproxen (NAPROSYN) 500 MG tablet Take 1 tablet (500 mg total) by mouth 2 (two) times daily. 07/19/16   Barrett HenleNicole Elizabeth Zuma Hust, PA-C    Family History No family history on file.  Social History Social History  Substance Use Topics  . Smoking status: Never Smoker  . Smokeless tobacco: Never Used  . Alcohol use No     Allergies   Patient has no known allergies.   Review of Systems Review of Systems  Constitutional: Negative for fever.  Respiratory: Negative for shortness of breath.   Cardiovascular: Negative for chest pain.  Gastrointestinal: Negative for abdominal pain and vomiting.  Musculoskeletal: Positive for back pain.     Physical Exam Updated Vital Signs BP 135/85 (BP Location: Right Arm)   Pulse 70   Temp 98.3 F (36.8 C) (Oral)   Resp 16   SpO2 100%   Physical Exam  Constitutional: He is oriented to person, place, and time. He appears well-developed and well-nourished.  HENT:  Head: Normocephalic and atraumatic.  Eyes: Conjunctivae and EOM are normal. Right eye exhibits no discharge. Left eye exhibits no discharge. No scleral icterus.  Neck: Normal range of motion. Neck supple.  Cardiovascular: Normal rate, regular rhythm, normal heart sounds and intact distal pulses.   Pulmonary/Chest: Effort normal and breath sounds normal. No respiratory distress. He has no wheezes. He has no rales. He exhibits no tenderness.  Abdominal: Soft.  Bowel sounds are normal. He exhibits no distension and no mass. There is no tenderness. There is no rebound and no guarding. No hernia.  Musculoskeletal: He exhibits no edema.  No midline C, T, or L tenderness. Mild TTP over right lumbar paraspinal muscles. Negative straight leg raise bilaterally. Full range of motion of neck and back. Full range of motion of bilateral upper and lower extremities, with 5/5 strength. Sensation intact. 2+ radial and PT pulses. Cap refill <2  seconds. Patient able to stand and ambulate without assistance.    Neurological: He is alert and oriented to person, place, and time. He has normal strength. He displays normal reflexes. No sensory deficit. Gait normal.  Skin: Skin is warm and dry.  Nursing note and vitals reviewed.    ED Treatments / Results  DIAGNOSTIC STUDIES:  Oxygen Saturation is 100% on RA, normal by my interpretation.    COORDINATION OF CARE:  3:37 PM Discussed treatment plan with pt at bedside and pt agreed to plan.   Labs (all labs ordered are listed, but only abnormal results are displayed) Labs Reviewed - No data to display  EKG  EKG Interpretation None       Radiology No results found.  Procedures Procedures (including critical care time)  Medications Ordered in ED Medications - No data to display   Initial Impression / Assessment and Plan / ED Course  I have reviewed the triage vital signs and the nursing notes.  Pertinent labs & imaging results that were available during my care of the patient were reviewed by me and considered in my medical decision making (see chart for details).  Clinical Course     Patient with back pain.  No neurological deficits and normal neuro exam.  Patient can walk. No midline spinal tenderness.  No loss of bowel or bladder control.  No concern for cauda equina.  No fever, night sweats, weight loss, h/o cancer, IVDU.  Plan discharge patient home with NSAIDs, muscle relaxant and symptomatically treatment. Advised patient to follow up with PCP as needed. Discussed return precautions.   Final Clinical Impressions(s) / ED Diagnoses   Final diagnoses:  Acute right-sided low back pain without sciatica    New Prescriptions Discharge Medication List as of 07/19/2016  3:57 PM    START taking these medications   Details  methocarbamol (ROBAXIN) 500 MG tablet Take 1 tablet (500 mg total) by mouth 2 (two) times daily., Starting Wed 07/19/2016, Print    naproxen  (NAPROSYN) 500 MG tablet Take 1 tablet (500 mg total) by mouth 2 (two) times daily., Starting Wed 07/19/2016, Print      I personally performed the services described in this documentation, which was scribed in my presence. The recorded information has been reviewed and is accurate.     Satira Sarkicole Elizabeth SartellNadeau, [place]-C 07/19/16 1630    Lorre NickAnthony Allen, MD 07/19/16 [id number]

## 2017-04-01 ENCOUNTER — Encounter (HOSPITAL_COMMUNITY): Payer: Self-pay | Admitting: *Deleted

## 2017-04-01 ENCOUNTER — Emergency Department (HOSPITAL_COMMUNITY)
Admission: EM | Admit: 2017-04-01 | Discharge: 2017-04-01 | Disposition: A | Discharge status: Home / Self Care | Payer: 59 | Attending: Emergency Medicine | Admitting: Emergency Medicine

## 2017-04-01 DIAGNOSIS — Z91010 Allergy to peanuts: Secondary | ICD-10-CM | POA: Diagnosis not present

## 2017-04-01 DIAGNOSIS — M545 Low back pain: Secondary | ICD-10-CM | POA: Diagnosis not present

## 2017-04-01 DIAGNOSIS — M5431 Sciatica, right side: Secondary | ICD-10-CM | POA: Insufficient documentation

## 2017-04-01 DIAGNOSIS — M5441 Lumbago with sciatica, right side: Secondary | ICD-10-CM | POA: Diagnosis not present

## 2017-04-01 MED ORDER — DICLOFENAC SODIUM 50 MG PO TBEC: 50.0000 mg | DELAYED_RELEASE_TABLET | Freq: Two times a day (BID) | ORAL | 0 refills | Status: AC

## 2017-04-01 MED ORDER — CYCLOBENZAPRINE HCL 10 MG PO TABS: 10.0000 mg | ORAL_TABLET | Freq: Two times a day (BID) | ORAL | 0 refills | Status: AC | PRN

## 2017-04-01 NOTE — ED Provider Notes (Signed)
[facility]-EMERGENCY DEPT Provider Note   CSN: [id number] Arrival date & time: 04/01/17  1113     History   Chief Complaint Chief Complaint  Patient presents with  . Back Pain    HPI Mark Harvey is a 26 y.o. male who presents to the ED with back pain. The patient reports that the pain is in the lower back. The pain started 3 days ago and he used heat. 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 Patient reports that when he went home that day he did stretching exercises and when he work the next morning his back pain was worse. He rates his pain as 8/10. Patient reports having back problems in the past and this feels similar.  The history is provided by the patient. No language interpreter was used.  Back Pain   This is a new problem. The current episode started more than 2 days ago. The problem occurs constantly. The problem has been gradually worsening. The pain is associated with no known injury. The pain is present in the lumbar spine. Quality: sharp. The pain radiates to the right thigh. The pain is at a severity of 8/10. The symptoms are aggravated by bending, twisting and certain positions. The pain is the same all the time. Pertinent negatives include no fever, no weight loss, no headaches, no abdominal pain, no bowel incontinence, no bladder incontinence and no dysuria. He has tried heat for the symptoms. The treatment provided mild relief.    History reviewed. No pertinent past medical history.  There are no active problems to display for this patient.   History reviewed. No pertinent surgical history.     Home Medications    Prior to Admission medications   Medication Sig Start Date End Date Taking? Authorizing Provider  cyclobenzaprine (FLEXERIL) 10 MG tablet Take 1 tablet (10 mg total) by mouth 2 (two) times daily as needed for muscle spasms. 04/01/17   Janne Napoleon, NP  diclofenac (VOLTAREN) 50 MG EC  tablet Take 1 tablet (50 mg total) by mouth 2 (two) times daily. 04/01/17   Janne Napoleon, NP    Family History History reviewed. No pertinent family history.  Social History Social History  Substance Use Topics  . Smoking status: Never Smoker  . Smokeless tobacco: Never Used  . Alcohol use No     Allergies   Peanut butter flavor   Review of Systems Review of Systems  Constitutional: Negative for chills, fever and weight loss.  HENT: Negative.   Eyes: Negative for visual disturbance.  Gastrointestinal: Negative for abdominal pain, bowel incontinence, nausea and vomiting.  Genitourinary: Negative for bladder incontinence, dysuria and frequency.  Musculoskeletal: Positive for back pain. Negative for neck pain.  Skin: Negative for rash and wound.  Neurological: Negative for syncope and headaches.  Psychiatric/Behavioral: Negative for confusion. The patient is not nervous/anxious.      Physical Exam Updated Vital Signs BP 134/79 (BP Location: Left Arm)   Pulse 60   Temp 98.5 F (36.9 C) (Oral)   Resp 18   Ht 5\' 10"  (1.778 m)   Wt 68 kg (150 lb)   SpO2 100%   BMI 21.52 kg/m   Physical Exam  Constitutional: He appears well-developed and well-nourished. No distress.  HENT:  Head: Normocephalic and atraumatic.  Mouth/Throat: Mucous membranes are normal.  Eyes: Pupils are equal, round, and reactive to light. EOM are normal.  Neck: Normal range of motion. Neck supple.  Cardiovascular: Normal rate and regular rhythm.   Pulmonary/Chest: Effort normal and breath sounds normal.  Abdominal: Soft. Bowel sounds are normal. There is no tenderness.  Musculoskeletal: Normal range of motion. He exhibits no edema.       Lumbar back: He exhibits tenderness and spasm. He exhibits no deformity and normal pulse.       Back:  Neurological: He is alert. He has normal strength. No sensory deficit. He displays a negative Romberg sign. Gait normal.  Reflex Scores:      Bicep reflexes are  2+ on the right side and 2+ on the left side.      Brachioradialis reflexes are 2+ on the right side and 2+ on the left side.      Patellar reflexes are 2+ on the right side and 2+ on the left side. Skin: Skin is warm and dry.  Psychiatric: He has a normal mood and affect. His behavior is normal.  Nursing note and vitals reviewed.    ED Treatments / Results  Labs (all labs ordered are listed, but only abnormal results are displayed) Labs Reviewed - No data to display Radiology No results found.  Procedures Procedures (including critical care time)  Medications Ordered in ED Medications - No data to display   Initial Impression / Assessment and Plan / ED Course  I have reviewed the triage vital signs and the nursing notes.  Patient with back pain.  No neurological deficits and normal neuro exam.  Patient can walk but states is painful.  No loss of bowel or bladder control.  No concern for cauda equina.  No fever, night sweats, weight loss, h/o cancer, IVDU.  RICE protocol and pain medicine indicated and discussed with patient.   Final Clinical Impressions(s) / ED Diagnoses   Final diagnoses:  Sciatica, right side    New Prescriptions New Prescriptions   CYCLOBENZAPRINE (FLEXERIL) 10 MG TABLET    Take 1 tablet (10 mg total) by mouth 2 (two) times daily as needed for muscle spasms.   DICLOFENAC (VOLTAREN) 50 MG EC TABLET    Take 1 tablet (50 mg total) by mouth 2 (two) times daily.     Kerrie Buffaloeese, Charlton Boule W[place] SpringsM, [place]NP 04/01/17 1551    Doug SouJacubowitz, Sam, MD 04/01/17 1728

## 2017-04-01 NOTE — Discharge Instructions (Signed)
Do not take the muscle relaxant if driving as it will make you sleepy. Follow up with your regular doctor in the next few days. Return here for worsening symptoms.

## 2017-04-01 NOTE — ED Triage Notes (Signed)
Patient is alert and oriented x4.  He is being seen for lower back pain.  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  patient adds that he went home to do stretches and when he woke up the next morning that his back hurt worse.  Currently he rates his pain 8 of 10

## 2018-08-22 ENCOUNTER — Encounter (HOSPITAL_COMMUNITY): Payer: Self-pay

## 2018-08-22 ENCOUNTER — Emergency Department (HOSPITAL_COMMUNITY)
Admission: EM | Admit: 2018-08-22 | Discharge: 2018-08-22 | Disposition: A | Discharge status: Home / Self Care | Payer: Self-pay | Attending: Emergency Medicine | Admitting: Emergency Medicine

## 2018-08-22 ENCOUNTER — Emergency Department (HOSPITAL_COMMUNITY): Payer: Self-pay

## 2018-08-22 DIAGNOSIS — R69 Illness, unspecified: Secondary | ICD-10-CM

## 2018-08-22 DIAGNOSIS — Z79899 Other long term (current) drug therapy: Secondary | ICD-10-CM | POA: Insufficient documentation

## 2018-08-22 DIAGNOSIS — Z91010 Allergy to peanuts: Secondary | ICD-10-CM | POA: Insufficient documentation

## 2018-08-22 DIAGNOSIS — J111 Influenza due to unidentified influenza virus with other respiratory manifestations: Secondary | ICD-10-CM | POA: Insufficient documentation

## 2018-08-22 NOTE — Discharge Instructions (Addendum)
Today your x-rays did not show any pneumonia or other abnormality.    Today your symptoms are consistent with a flu or a flulike illness.  As I did not test you for the flu we call this a flulike illness.  I have given you information to read on the flu as most of this still applies.  If you have not already, please consider getting a flu shot once you are well.  Please make sure to practice good hand hygiene to help prevent the spread of flu.  We discussed today that many symptoms of the flu such as fever, not feeling well, and body aches can also be the first signs of more serious illnesses or infections.  If you have any new or worsening symptoms please seek additional medical care and evaluation.

## 2018-08-22 NOTE — ED Provider Notes (Signed)
[facility]-EMERGENCY DEPT Provider Note   CSN: [id number] Arrival date & time: 08/22/18  1425     History   Chief Complaint Chief Complaint  Patient presents with  . Generalized Body Aches  . Cough    HPI Mark Harvey is a 28 y.o. male who presents today for evaluation of body aches since last Friday.  He reports feeling generally weak and like he has the flu.  He denies any known sick contacts.  He reports fever, chills, headache, cough that is occasionally productive.  He vomited twice two days ago but has not had any nausea or repeat emesis since.  He reports that he thinks he vomited due to coughing.    He denies any abdominal pain, chest pain, or shortness of breath.    HPI  History reviewed. No pertinent past medical history.  There are no active problems to display for this patient.   History reviewed. No pertinent surgical history.      Home Medications    Prior to Admission medications   Medication Sig Start Date End Date Taking? Authorizing Provider  Acetaminophen (TYLENOL PO) Take 2 tablets by mouth daily as needed (pain).   Yes [provider]  Multiple Vitamins-Minerals (MEGA MULTI MEN PO) Take 1 tablet by mouth daily.   Yes [provider]  cyclobenzaprine (FLEXERIL) 10 MG tablet Take 1 tablet (10 mg total) by mouth 2 (two) times daily as needed for muscle spasms. Patient not taking: Reported on 08/22/2018 04/01/17   Janne Napoleon, NP  diclofenac (VOLTAREN) 50 MG EC tablet Take 1 tablet (50 mg total) by mouth 2 (two) times daily. Patient not taking: Reported on 08/22/2018 04/01/17   Janne Napoleon, NP    Family History History reviewed. No pertinent family history.  Social History Social History   Tobacco Use  . Smoking status: Never Smoker  . Smokeless tobacco: Never Used  Substance Use Topics  . Alcohol use: No    Alcohol/week: 0.0 standard drinks  . Drug use: No     Allergies   Peanut butter  flavor   Review of Systems Review of Systems  Constitutional: Positive for chills and fever.  HENT: Negative for congestion, facial swelling and sinus pain.   Respiratory: Positive for cough. Negative for shortness of breath.   Cardiovascular: Negative for chest pain.  Gastrointestinal: Positive for vomiting. Negative for abdominal pain, constipation, diarrhea and nausea.  Musculoskeletal: Positive for back pain. Negative for neck pain and neck stiffness.  Neurological: Negative for weakness.  All other systems reviewed and are negative.    Physical Exam Updated Vital Signs BP 139/79 (BP Location: Right Arm)   Pulse 79   Temp 98.1 F (36.7 C) (Oral)   Resp 18   SpO2 99%   Physical Exam Vitals signs and nursing note reviewed.  Constitutional:      General: He is not in acute distress.    Appearance: He is well-developed. He is not diaphoretic.  HENT:     Head: Normocephalic and atraumatic.     Right Ear: Tympanic membrane, ear canal and external ear normal.     Left Ear: Tympanic membrane, ear canal and external ear normal.     Nose: Nose normal.     Mouth/Throat:     Mouth: Mucous membranes are moist.  Eyes:     General: No scleral icterus.       Right eye: No discharge.        Left eye:  No discharge.     Conjunctiva/sclera: Conjunctivae normal.  Neck:     Musculoskeletal: Normal range of motion and neck supple. No neck rigidity or muscular tenderness.  Cardiovascular:     Rate and Rhythm: Normal rate and regular rhythm.  Pulmonary:     Effort: Pulmonary effort is normal. No accessory muscle usage or respiratory distress.     Breath sounds: Normal air entry. No stridor.     Comments: Crackles in left upper field.  Chest:     Chest wall: No tenderness.  Abdominal:     General: There is no distension.     Tenderness: There is no abdominal tenderness.  Musculoskeletal:        General: No deformity.     Comments: No midline C/T/L spine TTP.   Lymphadenopathy:      Cervical: No cervical adenopathy.  Skin:    General: Skin is warm and dry.  Neurological:     General: No focal deficit present.     Mental Status: He is alert.     Motor: No abnormal muscle tone.  Psychiatric:        Mood and Affect: Mood normal.        Behavior: Behavior normal.      ED Treatments / Results  Labs (all labs ordered are listed, but only abnormal results are displayed) Labs Reviewed - No data to display  EKG None  Radiology Dg Chest 2 View  Result Date: 08/22/2018 CLINICAL DATA:  Flu like symptoms.  Fever and cough. EXAM: CHEST - 2 VIEW COMPARISON:  July 13, 2010 FINDINGS: The heart size and mediastinal contours are within normal limits. Both lungs are clear. The visualized skeletal structures are unremarkable. IMPRESSION: No active cardiopulmonary disease. Electronically Signed   By: Gerome Samavid  Williams III M.D   On: 08/22/2018 16:44    Procedures Procedures (including critical care time)  Medications Ordered in ED Medications - No data to display   Initial Impression / Assessment and Plan / ED Course  I have reviewed the triage vital signs and the nursing notes.  Pertinent labs & imaging results that were available during my care of the patient were reviewed by me and considered in my medical decision making (see chart for details).    Patient with symptoms consistent with influenza.  No signs of dehydration, tolerating PO's.  Lungs have mild crackles in the left upper field, therefore chest x-ray was performed which did not show evidence of pneumonia or other consolidation.  Discussed with patient that he is far outside the Tamiflu window.  He does not have any shortness of breath, no abdominal pain, chest pain.  Recommended conservative care, he is given instructions to orally hydrate.    Return precautions were discussed with patient who states their understanding.  At the time of discharge patient denied any unaddressed complaints or concerns.   Patient is agreeable for discharge home.   Final Clinical Impressions(s) / ED Diagnoses   Final diagnoses:  Influenza-like illness    ED Discharge Orders    None       Norman ClayHammond,  W, PA-C 08/22/18 2211    Shaune PollackIsaacs, [place], MD 08/23/18 1425

## 2018-08-22 NOTE — ED Triage Notes (Signed)
Pt presents with c/o generalized body aches and cough since Friday of last week. Pt concerned he may have the flu. Pt reports feeling weak.

## 2021-05-17 ENCOUNTER — Other Ambulatory Visit: Payer: Self-pay

## 2021-05-17 ENCOUNTER — Encounter (HOSPITAL_BASED_OUTPATIENT_CLINIC_OR_DEPARTMENT_OTHER): Payer: Self-pay | Admitting: *Deleted

## 2021-05-17 ENCOUNTER — Emergency Department (HOSPITAL_BASED_OUTPATIENT_CLINIC_OR_DEPARTMENT_OTHER)
Admission: EM | Admit: 2021-05-17 | Discharge: 2021-05-17 | Disposition: A | Discharge status: Home / Self Care | Payer: BC Managed Care – PPO | Attending: Emergency Medicine | Admitting: Emergency Medicine

## 2021-05-17 DIAGNOSIS — R112 Nausea with vomiting, unspecified: Secondary | ICD-10-CM | POA: Insufficient documentation

## 2021-05-17 DIAGNOSIS — Z20822 Contact with and (suspected) exposure to covid-19: Secondary | ICD-10-CM | POA: Diagnosis not present

## 2021-05-17 DIAGNOSIS — R42 Dizziness and giddiness: Secondary | ICD-10-CM

## 2021-05-17 DIAGNOSIS — Z91010 Allergy to peanuts: Secondary | ICD-10-CM | POA: Insufficient documentation

## 2021-05-17 DIAGNOSIS — R6883 Chills (without fever): Secondary | ICD-10-CM

## 2021-05-17 DIAGNOSIS — E860 Dehydration: Secondary | ICD-10-CM | POA: Insufficient documentation

## 2021-05-17 LAB — COMPREHENSIVE METABOLIC PANEL
ALT: 19 U/L (ref 0–44)
AST: 28 U/L (ref 15–41)
Albumin: 4.7 g/dL (ref 3.5–5.0)
Alkaline Phosphatase: 49 U/L (ref 38–126)
Anion gap: 10 (ref 5–15)
BUN: 12 mg/dL (ref 6–20)
CO2: 26 mmol/L (ref 22–32)
Calcium: 9.8 mg/dL (ref 8.9–10.3)
Chloride: 101 mmol/L (ref 98–111)
Creatinine, Ser: 1.31 mg/dL — ABNORMAL HIGH (ref 0.61–1.24)
GFR, Estimated: >60 mL/min (ref >60)
Glucose, Bld: 90 mg/dL (ref 70–99)
Potassium: 4.1 mmol/L (ref 3.5–5.1)
Sodium: 137 mmol/L (ref 135–145)
Total Bilirubin: 0.8 mg/dL (ref 0.3–1.2)
Total Protein: 8.3 g/dL — ABNORMAL HIGH (ref 6.5–8.1)

## 2021-05-17 LAB — CBC WITH DIFFERENTIAL/PLATELET
Abs Immature Granulocytes: 0.03 10*3/uL (ref 0.00–0.07)
Basophils Absolute: 0 10*3/uL (ref 0.0–0.1)
Basophils Relative: 0 %
Eosinophils Absolute: 0 10*3/uL (ref 0.0–0.5)
Eosinophils Relative: 0 %
HCT: 45.9 % (ref 39.0–52.0)
Hemoglobin: 15.1 g/dL (ref 13.0–17.0)
Immature Granulocytes: 0 %
Lymphocytes Relative: 24 %
Lymphs Abs: 1.6 10*3/uL (ref 0.7–4.0)
MCH: 29.7 pg (ref 26.0–34.0)
MCHC: 32.9 g/dL (ref 30.0–36.0)
MCV: 90.4 fL (ref 80.0–100.0)
Monocytes Absolute: 0.6 10*3/uL (ref 0.1–1.0)
Monocytes Relative: 10 %
Neutro Abs: 4.5 10*3/uL (ref 1.7–7.7)
Neutrophils Relative %: 66 %
Platelets: 234 10*3/uL (ref 150–400)
RBC: 5.08 MIL/uL (ref 4.22–5.81)
RDW: 13.7 % (ref 11.5–15.5)
WBC: 6.8 10*3/uL (ref 4.0–10.5)
nRBC: 0 % (ref 0.0–0.2)

## 2021-05-17 LAB — RESP PANEL BY RT-PCR (FLU A&B, COVID) ARPGX2
Influenza A by PCR: NEGATIVE
Influenza B by PCR: NEGATIVE
SARS Coronavirus 2 by RT PCR: NEGATIVE

## 2021-05-17 MED ORDER — ONDANSETRON 4 MG PO TBDP
4.0000 mg | ORAL_TABLET | Freq: Three times a day (TID) | ORAL | 0 refills | Status: AC | PRN
Start: 2021-05-17 — End: ?

## 2021-05-17 MED ORDER — ONDANSETRON 4 MG PO TBDP: 4.0000 mg | ORAL_TABLET | Freq: Three times a day (TID) | ORAL | 0 refills | Status: DC | PRN

## 2021-05-17 MED ORDER — ONDANSETRON 4 MG PO TBDP
4.0000 mg | ORAL_TABLET | Freq: Once | ORAL | Status: AC
  Administered 2021-05-17: 4 mg via ORAL
  Filled 2021-05-17: qty 1

## 2021-05-17 NOTE — ED Notes (Signed)
Pt tolerated POs well, states he is feeling beter

## 2021-05-17 NOTE — ED Triage Notes (Signed)
Vomiting and dizziness since yesterday. Pt is ambulatory.

## 2021-05-17 NOTE — ED Provider Notes (Signed)
[facility] EMERGENCY DEPARTMENT Provider Note   CSN: [id number] Arrival date & time: 05/17/21  1158     History Chief Complaint  Patient presents with   Emesis   Dizziness    Mark Harvey is a 30 y.o. male.  The history is provided by the patient, medical records and a relative. No language interpreter was used.  Emesis Severity:  Moderate Duration:  2 days Timing:  Intermittent Quality:  Stomach contents Progression:  Unchanged Chronicity:  New Recent urination:  Normal Worsened by:  Nothing Ineffective treatments:  None tried Associated symptoms: chills   Associated symptoms: no abdominal pain, no cough, no diarrhea, no fever, no headaches, no myalgias, no sore throat and no URI   Dizziness Quality:  Lightheadedness Severity:  Mild Onset quality:  Gradual Duration:  2 days Timing:  Constant Progression:  Waxing and waning Chronicity:  New Relieved by:  Nothing Worsened by:  Nothing Ineffective treatments:  None tried Associated symptoms: nausea and vomiting   Associated symptoms: no blood in stool, no chest pain, no diarrhea, no headaches, no palpitations, no shortness of breath, no syncope, no vision changes and no weakness   Risk factors: no multiple medications and no new medications       History reviewed. No pertinent past medical history.  There are no problems to display for this patient.   History reviewed. No pertinent surgical history.     No family history on file.  Social History   Tobacco Use   Smoking status: Never   Smokeless tobacco: Never  Vaping Use   Vaping Use: Every day  Substance Use Topics   Alcohol use: Not Currently   Drug use: No    Home Medications Prior to Admission medications   Medication Sig Start Date End Date Taking? Authorizing Provider  Acetaminophen (TYLENOL PO) Take 2 tablets by mouth daily as needed (pain).    [provider]  cyclobenzaprine (FLEXERIL) 10 MG tablet Take 1 tablet  (10 mg total) by mouth 2 (two) times daily as needed for muscle spasms. Patient not taking: No sig reported 04/01/17   Janne Napoleon, NP  diclofenac (VOLTAREN) 50 MG EC tablet Take 1 tablet (50 mg total) by mouth 2 (two) times daily. Patient not taking: No sig reported 04/01/17   Janne Napoleon, NP  Multiple Vitamins-Minerals (MEGA MULTI MEN PO) Take 1 tablet by mouth daily.    [provider]    Allergies    Peanut butter flavor  Review of Systems   Review of Systems  Constitutional:  Positive for chills and fatigue. Negative for diaphoresis and fever.  HENT:  Negative for congestion and sore throat.   Eyes:  Negative for visual disturbance.  Respiratory:  Negative for cough, chest tightness and shortness of breath.   Cardiovascular:  Negative for chest pain, palpitations, leg swelling and syncope.  Gastrointestinal:  Positive for nausea and vomiting. Negative for abdominal pain, blood in stool and diarrhea.  Genitourinary:  Negative for dysuria, enuresis, flank pain and frequency.  Musculoskeletal:  Negative for back pain and myalgias.  Neurological:  Positive for light-headedness. Negative for dizziness, seizures, syncope, facial asymmetry, weakness, numbness and headaches.  Psychiatric/Behavioral:  Negative for agitation and confusion.   All other systems reviewed and are negative.  Physical Exam Updated Vital Signs BP (!) 136/53 (BP Location: Left Arm)   Pulse (!) 49   Temp 98.7 F (37.1 C) (Oral)   Resp 16   Ht 5\' 11"  (1.803 m)  Wt 68 kg   SpO2 100%   BMI 20.92 kg/m   Physical Exam Vitals and nursing note reviewed.  Constitutional:      General: He is not in acute distress.    Appearance: He is well-developed. He is not ill-appearing, toxic-appearing or diaphoretic.  HENT:     Head: Normocephalic and atraumatic.     Nose: No congestion or rhinorrhea.     Mouth/Throat:     Mouth: Mucous membranes are dry.     Pharynx: No oropharyngeal exudate or posterior  oropharyngeal erythema.  Eyes:     Extraocular Movements: Extraocular movements intact.     Conjunctiva/sclera: Conjunctivae normal.     Pupils: Pupils are equal, round, and reactive to light.  Cardiovascular:     Rate and Rhythm: Normal rate and regular rhythm.     Heart sounds: No murmur heard. Pulmonary:     Effort: Pulmonary effort is normal. No respiratory distress.     Breath sounds: Normal breath sounds. No wheezing, rhonchi or rales.  Chest:     Chest wall: No tenderness.  Abdominal:     General: Abdomen is flat. There is no distension.     Palpations: Abdomen is soft.     Tenderness: There is no abdominal tenderness. There is no right CVA tenderness, left CVA tenderness, guarding or rebound.  Musculoskeletal:        General: No tenderness.     Cervical back: Neck supple. No tenderness.  Skin:    General: Skin is warm and dry.     Capillary Refill: Capillary refill takes less than 2 seconds.     Findings: No erythema.  Neurological:     General: No focal deficit present.     Mental Status: He is alert.  Psychiatric:        Mood and Affect: Mood normal.    ED Results / Procedures / Treatments   Labs (all labs ordered are listed, but only abnormal results are displayed) Labs Reviewed  COMPREHENSIVE METABOLIC PANEL - Abnormal; Notable for the following components:      Result Value   Creatinine, Ser 1.31 (*)    Total Protein 8.3 (*)    All other components within normal limits  RESP PANEL BY RT-PCR (FLU A&B, COVID) ARPGX2  CBC WITH DIFFERENTIAL/PLATELET  URINALYSIS, ROUTINE W REFLEX MICROSCOPIC    EKG None  Radiology No results found.  Procedures Procedures   Medications Ordered in ED Medications  ondansetron (ZOFRAN-ODT) disintegrating tablet 4 mg (4 mg Oral Given 05/17/21 1548)    ED Course  I have reviewed the triage vital signs and the nursing notes.  Pertinent labs & imaging results that were available during my care of the patient were reviewed  by me and considered in my medical decision making (see chart for details).    MDM Rules/Calculators/A&P                           Mark Harvey is a 30 y.o. male with no significant past medical history who presents with 2 days of nausea, vomiting, and lightheadedness.  According to patient, he had a normal weekend and was feeling well and did not have any suspicious food intake but then yesterday woke up nauseous.  He reports has had some vomiting since yesterday has not been eating or drinking.  He reports he has felt lightheaded since then and felt somewhat dehydrated.  He denies any abdominal pain whatsoever with no  constipation, diarrhea, or urinary changes.  He denies fevers, chills congestion, cough, does report having some chills and sweats overnight.  He denies any COVID exposures and has not had COVID throughout the pandemic.  Denies any headache, neck pain, and reports his symptoms are lightheadedness and not dizziness.  Denies any neurologic complaints.  On exam, lungs are clear and chest is nontender.  Abdomen is completely nontender.  Normal bowel sounds.  Normal neurologic exam with normal sensations, strength, coordination testing, and pupil exam.  Symmetric smile and clear speech.  Patient well-appearing with no complaints or pain at this time.  He is all complaining of some nausea and some fatigue.  Patient had some lab work started in triage including CBC and CMP which only showed some concern for mild elevated creatinine at 1.3 but there is no prior for comparison.  I suspect this is due to the dehydration and decreased oral intake for 2 days with nausea and vomiting.  Will give the patient some nausea medicine and try oral challenge to see if he can drink.  If patient is able to drink, we agreed to hold on IV placement or IV fluids however patient gets more nauseous and cannot do so, will give IV fluids and IV nausea medicine.  We will get a COVID test given the chills and sweats  and the nausea and vomiting symptoms.  Will have a shared decision conversation to see if he wants to wait for urinalysis although he is having no urinary symptoms in the setting of his nausea and vomiting.  Given lack of any abdominal symptoms of pain or exam abnormalities, we agreed to hold on imaging or further work-up at this time.  If work-up is reassuring and patient is feeling better, dissipate discharge home for likely treatment of lightheadedness attributed to dehydration from nausea and vomiting.  5:15 PM COVID and flu negative.  Patient is feeling much better after the Zofran and getting some crackers and fluid in him.  He wants to go home.  He was able to provide urine for the urinalysis but does not want to wait on the result.  We will still send it off and he can follow-up on MyChart for it and follow-up with a PCP.  Patient agrees.  Will give prescription for nausea medicine and he will follow-up with PCP and return if any symptoms change or worsen.  Patient agrees plan of care with family and patient discharged in good condition.   Final Clinical Impression(s) / ED Diagnoses Final diagnoses:  Lightheaded  Dehydration  Chills  Nausea and vomiting, unspecified vomiting type    Rx / DC Orders ED Discharge Orders          Ordered    ondansetron (ZOFRAN ODT) 4 MG disintegrating tablet  Every 8 hours PRN        05/17/21 1717            Clinical Impression: 1. Lightheaded   2. Dehydration   3. Chills   4. Nausea and vomiting, unspecified vomiting type     Disposition: Discharge  Condition: Good  I have discussed the results, Dx and Tx plan with the pt(& family if present). He/she/they expressed understanding and agree(s) with the plan. Discharge instructions discussed at great length. Strict return precautions discussed and pt &/or family have verbalized understanding of the instructions. No further questions at time of discharge.    New Prescriptions    ONDANSETRON (ZOFRAN ODT) 4 MG DISINTEGRATING TABLET  Take 1 tablet (4 mg total) by mouth every 8 (eight) hours as needed for nausea or vomiting.    Follow Up: [facility] [address] [place] [address] [phone] Schedule an appointment as soon as possible for a visit    [facility] [place] [facility]PART[facility] [address] [id number] [address] [place] [address] [phone]       Ejay Lashley, Canary Brim, MD 05/17/21 1718

## 2021-05-17 NOTE — ED Notes (Signed)
No acute distress noted upon this RN's departure of patient. Verified discharge paperwork with name and DOB. Vital signs stable. Patient taken to checkout window. Discharge paperwork discussed with mother/patient. No further questions voiced upon discharge.   

## 2021-05-17 NOTE — ED Notes (Signed)
Zofran given po, saltines and ginger ale given to see if pt can tolerate

## 2021-05-17 NOTE — Discharge Instructions (Signed)
Your history, exam, work-up today was overall reassuring and consistent with lightheadedness related to the nausea vomiting and dehydration you described.  Your labs only showed slight increase in your creatinine and your electrolytes were otherwise reassuring.  Your COVID and flu test were negative.  As you are able to tolerate eating and drinking after the nausea medicine, we do feel is appropriate for you to be discharged to maintain hydration at home and rest for the next day.  Please follow-up with a primary doctor and use the nausea medicine.  If any symptoms change or worsen acutely, please return to the nearest emergency department.  Please follow-up on the results of the urinalysis with MyChart.

## 2021-07-06 ENCOUNTER — Emergency Department (HOSPITAL_COMMUNITY)
Admission: EM | Admit: 2021-07-06 | Discharge: 2021-07-06 | Disposition: A | Discharge status: Home / Self Care | Payer: BC Managed Care – PPO | Attending: Emergency Medicine | Admitting: Emergency Medicine

## 2021-07-06 ENCOUNTER — Emergency Department (HOSPITAL_COMMUNITY): Payer: BC Managed Care – PPO

## 2021-07-06 ENCOUNTER — Other Ambulatory Visit: Payer: Self-pay

## 2021-07-06 ENCOUNTER — Encounter (HOSPITAL_COMMUNITY): Payer: Self-pay | Admitting: Emergency Medicine

## 2021-07-06 DIAGNOSIS — Z79899 Other long term (current) drug therapy: Secondary | ICD-10-CM | POA: Insufficient documentation

## 2021-07-06 DIAGNOSIS — Z91010 Allergy to peanuts: Secondary | ICD-10-CM | POA: Insufficient documentation

## 2021-07-06 DIAGNOSIS — T507X1A Poisoning by analeptics and opioid receptor antagonists, accidental (unintentional), initial encounter: Secondary | ICD-10-CM | POA: Diagnosis present

## 2021-07-06 DIAGNOSIS — Y900 Blood alcohol level of less than 20 mg/100 ml: Secondary | ICD-10-CM | POA: Insufficient documentation

## 2021-07-06 DIAGNOSIS — T50901A Poisoning by unspecified drugs, medicaments and biological substances, accidental (unintentional), initial encounter: Secondary | ICD-10-CM

## 2021-07-06 LAB — CBG MONITORING, ED: Glucose-Capillary: 92 mg/dL (ref 70–99)

## 2021-07-06 LAB — CBC WITH DIFFERENTIAL/PLATELET
Abs Immature Granulocytes: 0.02 10*3/uL (ref 0.00–0.07)
Basophils Absolute: 0 10*3/uL (ref 0.0–0.1)
Basophils Relative: 0 %
Eosinophils Absolute: 0.1 10*3/uL (ref 0.0–0.5)
Eosinophils Relative: 2 %
HCT: 44.2 % (ref 39.0–52.0)
Hemoglobin: 14.1 g/dL (ref 13.0–17.0)
Immature Granulocytes: 0 %
Lymphocytes Relative: 33 %
Lymphs Abs: 2.1 10*3/uL (ref 0.7–4.0)
MCH: 29.4 pg (ref 26.0–34.0)
MCHC: 31.9 g/dL (ref 30.0–36.0)
MCV: 92.1 fL (ref 80.0–100.0)
Monocytes Absolute: 0.7 10*3/uL (ref 0.1–1.0)
Monocytes Relative: 11 %
Neutro Abs: 3.4 10*3/uL (ref 1.7–7.7)
Neutrophils Relative %: 54 %
Platelets: 237 10*3/uL (ref 150–400)
RBC: 4.8 MIL/uL (ref 4.22–5.81)
RDW: 13.2 % (ref 11.5–15.5)
WBC: 6.3 10*3/uL (ref 4.0–10.5)
nRBC: 0 % (ref 0.0–0.2)

## 2021-07-06 LAB — COMPREHENSIVE METABOLIC PANEL
ALT: 13 U/L (ref 0–44)
AST: 19 U/L (ref 15–41)
Albumin: 4.7 g/dL (ref 3.5–5.0)
Alkaline Phosphatase: 37 U/L — ABNORMAL LOW (ref 38–126)
Anion gap: 9 (ref 5–15)
BUN: 14 mg/dL (ref 6–20)
CO2: 26 mmol/L (ref 22–32)
Calcium: 9.2 mg/dL (ref 8.9–10.3)
Chloride: 104 mmol/L (ref 98–111)
Creatinine, Ser: 1.34 mg/dL — ABNORMAL HIGH (ref 0.61–1.24)
GFR, Estimated: >60 mL/min (ref >60)
Glucose, Bld: 117 mg/dL — ABNORMAL HIGH (ref 70–99)
Potassium: 4.1 mmol/L (ref 3.5–5.1)
Sodium: 139 mmol/L (ref 135–145)
Total Bilirubin: 0.8 mg/dL (ref 0.3–1.2)
Total Protein: 8.2 g/dL — ABNORMAL HIGH (ref 6.5–8.1)

## 2021-07-06 LAB — ETHANOL: Alcohol, Ethyl (B): <10 mg/dL (ref <10)

## 2021-07-06 MED ORDER — NALOXONE HCL 0.4 MG/ML IJ SOLN
0.4000 mg | Freq: Once | INTRAMUSCULAR | Status: AC
  Administered 2021-07-06: 0.4 mg via INTRAMUSCULAR
  Filled 2021-07-06: qty 1

## 2021-07-06 NOTE — ED Triage Notes (Signed)
Pt bib [facility] s/p OD.  Pt was with a friend and smoked marijuana when they arrived at home pt was not walking or talking right upon entry pt had an episode of unresponsiveness.  Friend initiated CPR.  Per EMS they had to mechanically ventilate pt x 15 minutes, breathing was agonal.  Pt given 2 mg Narcan w/ positive effects.  Pt became responsive quickly returning to baseline.

## 2021-07-06 NOTE — ED Provider Notes (Addendum)
Emergency Medicine Provider Triage Evaluation Note  Mark Harvey , a 30 y.o. male  was evaluated in triage.  Pt here for eval of overdose. Used marijuana and then had a period of unresponsiveness. Received narcan and became responsive. He denies other drug use. He is very sleepy and therefore it is difficult to get a hx  Review of Systems  Positive: N/a Negative: N/a  Physical Exam  BP 128/84 (BP Location: Right Arm)   Pulse (!) 54   Temp 97.8 F (36.6 C) (Oral)   Resp 16   Ht 5\' 6"  (1.676 m)   Wt 72.6 kg   SpO2 99%   BMI 25.82 kg/m  Gen:   Sleepy but easily arousable to voice, no distress   Resp:  Normal effort      Medical Decision Making  Medically screening exam initiated at 6:28 PM.  Appropriate orders placed.  Shadman Tozzi was informed that the remainder of the evaluation will be completed by another provider, this initial triage assessment does not replace that evaluation, and the importance of remaining in the ED until their evaluation is complete.  Pt was given narcan and he really did not have much change. Will get basic labs , ETOH, UDS, and CT head. He will be kept in triage and observed   Avel Sensor, PA-C 07/06/21 1830    14/07/22, PA-C 07/06/21 1839    14/07/22, DO 07/06/21 2333

## 2021-07-06 NOTE — ED Notes (Signed)
An After Visit Summary was printed and given to the patient. Discharge instructions given and no further questions at this time.  Pt leaving with his mother. Pt A&Ox4. Pt ambulatory.

## 2021-07-06 NOTE — ED Provider Notes (Signed)
[facility]-EMERGENCY DEPT Provider Note   CSN: [id number] Arrival date & time: 07/06/21  1759     History Chief Complaint  Patient presents with   Drug Overdose    Sanay Belmar is a 30 y.o. male.  30 year old male with prior medical history as detailed below presents after suspected overdose.  Patient reports that he was smoking some marijuana.  He then became unresponsive.  Bystanders called EMS.  Patient was given Narcan with improvement in his breathing.  EMS did attempt to assist his ventilations with BVM.  At the time of my evaluation he is alert and comfortable.  He denies known ingestion/use of narcotics. He admits to smoking pot to get high.  The history is provided by the patient.  Drug Overdose This is a new problem. The current episode started 1 to 2 hours ago. The problem occurs constantly. The problem has not changed since onset.Pertinent negatives include no chest pain and no abdominal pain. Nothing aggravates the symptoms. Nothing relieves the symptoms.      History reviewed. No pertinent past medical history.  There are no problems to display for this patient.   History reviewed. No pertinent surgical history.     No family history on file.  Social History   Tobacco Use   Smoking status: Never   Smokeless tobacco: Never  Vaping Use   Vaping Use: Every day  Substance Use Topics   Alcohol use: Not Currently   Drug use: Yes    Home Medications Prior to Admission medications   Medication Sig Start Date End Date Taking? Authorizing Provider  Acetaminophen (TYLENOL PO) Take 2 tablets by mouth daily as needed (pain).    [provider]  cyclobenzaprine (FLEXERIL) 10 MG tablet Take 1 tablet (10 mg total) by mouth 2 (two) times daily as needed for muscle spasms. Patient not taking: No sig reported 04/01/17   Janne Napoleon, NP  diclofenac (VOLTAREN) 50 MG EC tablet Take 1 tablet (50 mg total) by mouth 2 (two) times  daily. Patient not taking: No sig reported 04/01/17   Janne Napoleon, NP  Multiple Vitamins-Minerals (MEGA MULTI MEN PO) Take 1 tablet by mouth daily.    [provider]  ondansetron (ZOFRAN ODT) 4 MG disintegrating tablet Take 1 tablet (4 mg total) by mouth every 8 (eight) hours as needed for nausea or vomiting. 05/17/21   Tegeler, Canary Brim, MD    Allergies    Peanut butter flavor  Review of Systems   Review of Systems  Cardiovascular:  Negative for chest pain.  Gastrointestinal:  Negative for abdominal pain.  All other systems reviewed and are negative.  Physical Exam Updated Vital Signs BP (!) 110/48   Pulse 67   Temp 97.8 F (36.6 C) (Oral)   Resp 18   Ht 5\' 6"  (1.676 m)   Wt 72.6 kg   SpO2 99%   BMI 25.82 kg/m   Physical Exam Vitals and nursing note reviewed.  Constitutional:      General: He is not in acute distress.    Appearance: Normal appearance. He is well-developed.  HENT:     Head: Normocephalic and atraumatic.  Eyes:     Conjunctiva/sclera: Conjunctivae normal.     Pupils: Pupils are equal, round, and reactive to light.  Cardiovascular:     Rate and Rhythm: Normal rate and regular rhythm.     Heart sounds: Normal heart sounds.  Pulmonary:     Effort: Pulmonary effort is normal.  No respiratory distress.     Breath sounds: Normal breath sounds.  Abdominal:     General: There is no distension.     Palpations: Abdomen is soft.     Tenderness: There is no abdominal tenderness.  Musculoskeletal:        General: No deformity. Normal range of motion.     Cervical back: Normal range of motion and neck supple.  Skin:    General: Skin is warm and dry.  Neurological:     General: No focal deficit present.     Mental Status: He is alert and oriented to person, place, and time.    ED Results / Procedures / Treatments   Labs (all labs ordered are listed, but only abnormal results are displayed) Labs Reviewed  COMPREHENSIVE METABOLIC PANEL -  Abnormal; Notable for the following components:      Result Value   Glucose, Bld 117 (*)    Creatinine, Ser 1.34 (*)    Total Protein 8.2 (*)    Alkaline Phosphatase 37 (*)    All other components within normal limits  CBC WITH DIFFERENTIAL/PLATELET  ETHANOL  RAPID URINE DRUG SCREEN, HOSP PERFORMED  CBG MONITORING, ED    EKG None  Radiology CT Head Wo Contrast  Result Date: 07/06/2021 CLINICAL DATA:  Altered mental status, respiratory failure EXAM: CT HEAD WITHOUT CONTRAST TECHNIQUE: Contiguous axial images were obtained from the base of the skull through the vertex without intravenous contrast. COMPARISON:  None. FINDINGS: Brain: Normal anatomic configuration. No abnormal intra or extra-axial mass lesion or fluid collection. No abnormal mass effect or midline shift. No evidence of acute intracranial hemorrhage or infarct. Ventricular size is normal. Cerebellum unremarkable. Vascular: Unremarkable Skull: Intact Sinuses/Orbits: Paranasal sinuses are clear. Orbits are unremarkable. Other: Mastoid air cells and middle ear cavities are clear. IMPRESSION: Normal exam.  No acute intracranial abnormality. Electronically Signed   By: Helyn Numbers M.D.   On: 07/06/2021 18:51    Procedures Procedures   Medications Ordered in ED Medications  naloxone [facility]) injection 0.4 mg (0.4 mg Intramuscular Given 07/06/21 1834)    ED Course  I have reviewed the triage vital signs and the nursing notes.  Pertinent labs & imaging results that were available during my care of the patient were reviewed by me and considered in my medical decision making (see chart for details).    MDM Rules/Calculators/A&P                           MDM  MSE complete  Braedan Meuth was evaluated in Emergency Department on 07/06/2021 for the symptoms described in the history of present illness. He was evaluated in the context of the global COVID-19 pandemic, which necessitated consideration that the patient might  be at risk for infection with the SARS-CoV-2 virus that causes COVID-19. Institutional protocols and algorithms that pertain to the evaluation of patients at risk for COVID-19 are in a state of rapid change based on information released by regulatory bodies including the CDC and federal and state organizations. These policies and algorithms were followed during the patient's care in the ED.  Patient presented after suspected accidental overdose.  Patient reports that he was smoking pot.  He then became unresponsive.  He cannot remember the exact details leading to his period of unresponsiveness.  EMS reports that patient was having agonal breathing and required BVM ventilations prior to administration of Narcan.  With Narcan the patient began to breathe  and is now with out complaint.  Screening labs and observation in the ED are without evidence of significant other pathology.  Patient is strongly advised to not use street drugs given the possibility that he could be laced with other agents such as fentanyl.  Importance of close follow-up stressed.  Strict return precautions given and understood.   Final Clinical Impression(s) / ED Diagnoses Final diagnoses:  Accidental overdose, initial encounter    Rx / DC Orders ED Discharge Orders     None        Wynetta Fines, MD 07/06/21 2051

## 2021-07-06 NOTE — Discharge Instructions (Signed)
Return for any problem.  ?

## 2021-08-16 ENCOUNTER — Encounter (HOSPITAL_COMMUNITY): Payer: Self-pay | Admitting: Registered Nurse

## 2021-08-16 ENCOUNTER — Ambulatory Visit (HOSPITAL_COMMUNITY)
Admission: EM | Admit: 2021-08-16 | Discharge: 2021-08-16 | Disposition: A | Discharge status: Home / Self Care | Payer: No Payment, Other | Attending: Registered Nurse | Admitting: Registered Nurse

## 2021-08-16 DIAGNOSIS — F32A Depression, unspecified: Secondary | ICD-10-CM | POA: Insufficient documentation

## 2021-08-16 DIAGNOSIS — F4323 Adjustment disorder with mixed anxiety and depressed mood: Secondary | ICD-10-CM

## 2021-08-16 DIAGNOSIS — Z560 Unemployment, unspecified: Secondary | ICD-10-CM | POA: Insufficient documentation

## 2021-08-16 DIAGNOSIS — R454 Irritability and anger: Secondary | ICD-10-CM

## 2021-08-16 NOTE — Discharge Instructions (Addendum)
You are encouraged to follow up with [facility] for outpatient treatment.  Walk in/ Open Access Hours: Monday - Friday 8AM - 11AM (To see provider and therapist) Friday - [place] (To see therapist only)  [facility] [address] [place], [place]   Additional Resources:  [facility],  [place], [place] [address] [phone]  [facility] [address] Melburn Popper  [place], [place] [address]  [phone]

## 2021-08-16 NOTE — Progress Notes (Signed)
°   08/16/21 1728  [facility] Triage Screening (Walk-ins at [facility] only)  How Did You Hear About [place]? Family/Friend  What Is the Reason for Your Visit/Call Today? Patient presents reporting worsening depression and anger issues.  He is hoping to get referrals for outpatient therapy.  How Long Has This Been Causing You Problems? 1-6 months  Have You Recently Had Any Thoughts About Hurting Yourself? No  Are You Planning to Commit Suicide/Harm Yourself At This time? No  Have you Recently Had Thoughts About Hurting Someone Karolee Ohs? No  Are You Planning To Harm Someone At This Time? No  Are you currently experiencing any auditory, visual or other hallucinations? No  Have You Used Any Alcohol or Drugs in the Past 24 Hours? No  Do you have any current medical co-morbidities that require immediate attention? No  Clinician description of patient physical appearance/behavior: Patient is calm, cooperative AAOx5  What Do You Feel Would Help You the Most Today? Treatment for Depression or other mood problem  If access to [facility] was not available, would you have sought care in the Emergency Department? No  Determination of Need Routine (7 days)  Options For Referral Medication Management;Outpatient Therapy

## 2021-08-16 NOTE — ED Provider Notes (Signed)
Behavioral Health Urgent Care Medical Screening Exam  Patient Name: Jaziel Bennett MRN: [id number] Date of Evaluation: 08/16/21 Chief Complaint:   Diagnosis:  Final diagnoses:  None    History of Present illness: Chris Cripps is a 31 y.o. male patient presented to [facility] as a walk in accompanied by his girlfriend with complaints of anger issues.  Avel Sensor, 30 y.o., male patient seen face to face by this provider, consulted with Dr. Earlene Plater; and chart reviewed on 08/16/21.  On evaluation Maclean Foister reports he came in today because his mother and grandmother wanted him to come get checked out because "I got a lot going on and my mental has been off."  Patient states he had trouble remembering, controlling his anger, and he has been hearing voices all his life.  Reports that the voices never go away and it is a male voice that can be encouraging at times and belittling at times.  States the voice doesn't tell him to do things (non command).  He denies suicidal/self-harm/homicidal ideation  and paranoia.  Reports he feels he need therapy.  Denies psychiatric history (no self-harm, suicide attempt, outpatient services, or psychiatric hospitalizations.)  Reports "a couple months ago I accidental overdosed on what they call it, oh narcotic but I won't trying to kill myself."  Reports the narcotic was cocaine.  States he hasn't done any drugs since.  Patient lives with his mother and girlfriend.  States he is currently unemployed.  Girlfriend at side and states that patient has problem controlling his anger and he has been depressed.  States he can't keep a job and if something doesn't go his way or he gets up set he just stops or "just drops it and doesn't go back or finish it."  States she doesn't feel that he is a danger to himself or anyone else.     During evaluation Sayer Masini is sitting in chair in no acute distress.  He is alert/oriented x 4; calm/cooperative; and  mood congruent with affect.  He is speaking in a clear tone at moderate volume, and normal pace; with good eye contact.  His thought process is coherent and relevant; There is no indication that he is currently responding to internal/external stimuli or experiencing delusional thought content; and he has denied suicidal/self-harm/homicidal ideation, psychosis, and paranoia.   Patient has remained calm throughout assessment and has answered questions appropriately.    At this time Caetano Oberhaus is educated and verbalizes understanding of mental health resources and other crisis services in the community. He is instructed to call 911 and present to the nearest emergency room should he experience any suicidal/homicidal ideation, auditory/visual/hallucinations, or detrimental worsening of hi mental health condition.   Resources given  Psychiatric Specialty Exam  Presentation  General Appearance:Appropriate for Environment; Casual  Eye Contact:Good  Speech:Clear and Coherent; Normal Rate  Speech Volume:Normal  Handedness:No data recorded  Mood and Affect  Mood:Depressed  Affect:Appropriate; Congruent   Thought Process  Thought Processes:Coherent; Goal Directed  Descriptions of Associations:Intact  Orientation:Full (Time, Place and Person)  Thought Content:Logical    Hallucinations:None  Ideas of Reference:None  Suicidal Thoughts:No  Homicidal Thoughts:No   Sensorium  Memory:Immediate Good; Recent Good; Remote Good  Judgment:Intact  Insight:Present   Executive Functions  Concentration:Good  Attention Span:Good  Recall:Good  Fund of Knowledge:Good  Language:Good   Psychomotor Activity  Psychomotor Activity:Normal   Assets  Assets:Communication Skills; Desire for Improvement; Housing; Social Support   Sleep  Sleep:Good  Number of  hours: No data recorded  Nutritional Assessment (For OBS and FBC admissions only) Has the patient had a weight loss or  gain of 10 pounds or more in the last 3 months?: No Has the patient had a decrease in food intake/or appetite?: No Does the patient have dental problems?: No Does the patient have eating habits or behaviors that may be indicators of an eating disorder including binging or inducing vomiting?: No Has the patient recently lost weight without trying?: 0 Has the patient been eating poorly because of a decreased appetite?: 0 Malnutrition Screening Tool Score: 0    Physical Exam: Physical Exam Vitals and nursing note reviewed. Exam conducted with a chaperone present.  Constitutional:      General: He is not in acute distress.    Appearance: Normal appearance. He is not ill-appearing.  Cardiovascular:     Rate and Rhythm: Normal rate.  Pulmonary:     Effort: Pulmonary effort is normal.  Musculoskeletal:        General: Normal range of motion.     Cervical back: Normal range of motion.  Skin:    General: Skin is warm and dry.  Neurological:     Mental Status: He is alert and oriented to person, place, and time.  Psychiatric:        Attention and Perception: Attention and perception normal. He does not perceive auditory or visual hallucinations.        Mood and Affect: Affect normal. Mood is depressed.        Speech: Speech normal.        Behavior: Behavior normal. Behavior is cooperative.        Thought Content: Thought content normal. Thought content is not paranoid or delusional. Thought content does not include homicidal or suicidal ideation.        Cognition and Memory: Cognition and memory normal.        Judgment: Judgment normal.   Review of Systems  Constitutional: Negative.   HENT: Negative.    Eyes: Negative.   Respiratory: Negative.    Cardiovascular: Negative.   Gastrointestinal: Negative.   Genitourinary: Negative.   Musculoskeletal: Negative.   Skin: Negative.   Neurological: Negative.   Endo/Heme/Allergies: Negative.   Psychiatric/Behavioral:  Depression: Stable.  Hallucinations: State he has been hearing voices all his life.  Male voice, non-command and it can be encouraging or belittling at times.. Substance abuse: Denies.  States drug use up to accidental overdose couple months ago and hasn't done any drugs since. Suicidal ideas: Denies.   Blood pressure 133/69, pulse 65, temperature 98.5 F (36.9 C), resp. rate 18, SpO2 99 %. There is no height or weight on file to calculate BMI.  Musculoskeletal: Strength & Muscle Tone: within normal limits Gait & Station: normal Patient leans: N/A   [facility] MSE Discharge Disposition for Follow up and Recommendations: Based on my evaluation the patient does not appear to have an emergency medical condition and can be discharged with resources and follow up care in outpatient services for Medication Management   Unity Luepke, NP 08/16/2021, 5:39 PM

## 2021-08-31 ENCOUNTER — Telehealth (HOSPITAL_COMMUNITY): Payer: Self-pay

## 2021-08-31 NOTE — BH Assessment (Signed)
Care Management - [facility] Follow Up Discharges  ° °Writer attempted to make contact with patient today and was unsuccessful.  Voicemail has not been set up.    ° °Per chart review, patient was provided with outpatient resources. ° °

## 2022-09-15 IMAGING — CT CT HEAD W/O CM
3 series · 16 of 47 positions shown, 19 images · non-contrast
Comparison: None.

CLINICAL DATA: Altered mental status, respiratory failure

EXAM:
CT HEAD WITHOUT CONTRAST
TECHNIQUE: Contiguous axial images were obtained from the base of the skull
through the vertex without intravenous contrast.

[Series 2: head wo · axial · 0.41mm/px · z∈[+1570,+1696]mm · 10 of 31 slices shown, 13 images]
[im 3/31  brain]
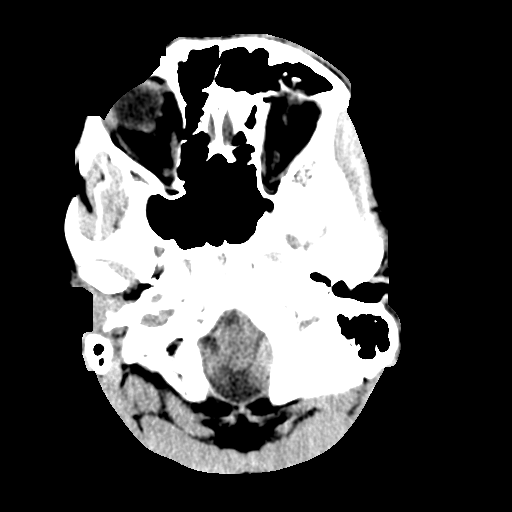
[im 3/31  bone]
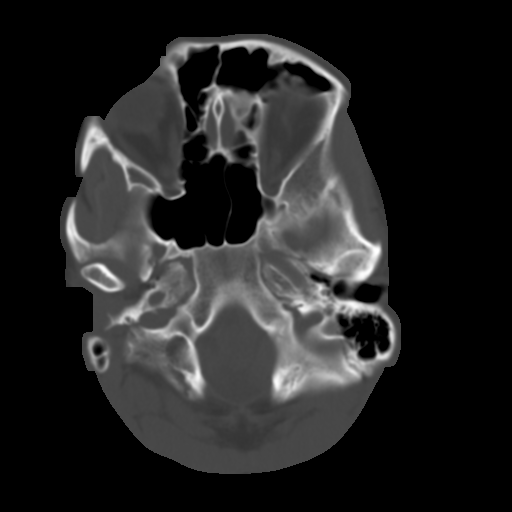
[im 6/31  brain]
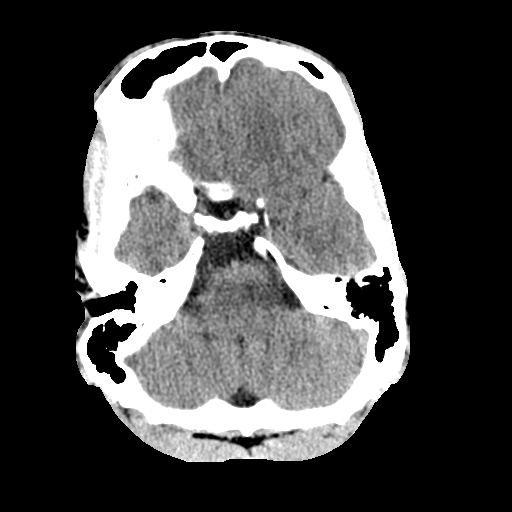
[im 9/31  brain]
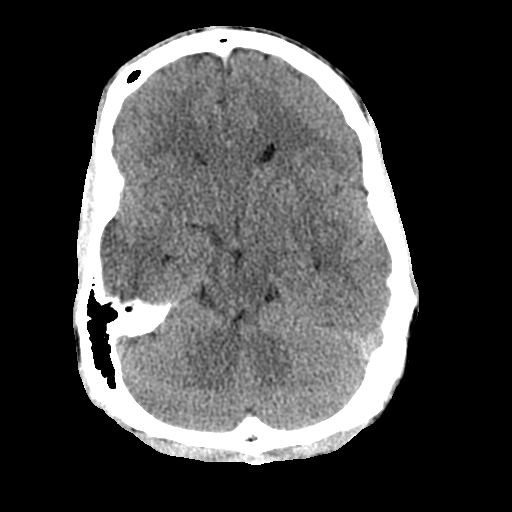
[im 11/31  brain]
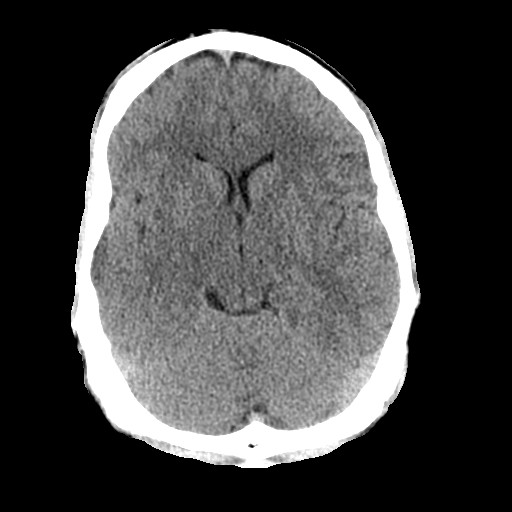
[im 14/31  brain]
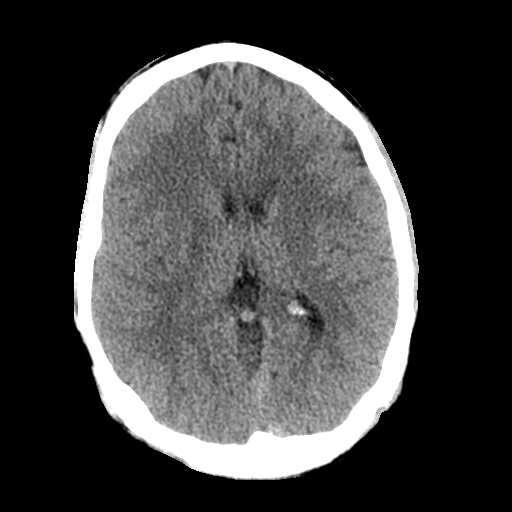
[im 14/31  bone]
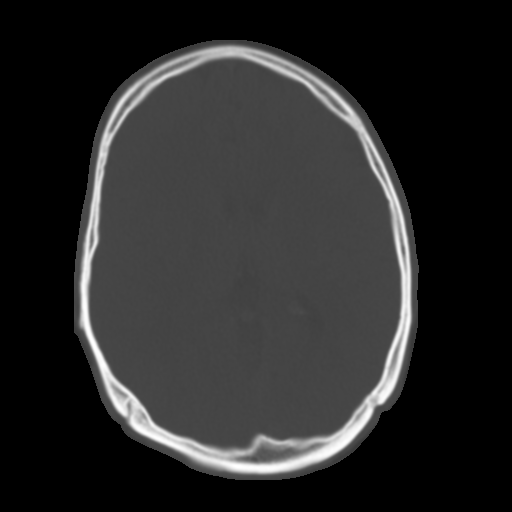
[im 17/31  brain]
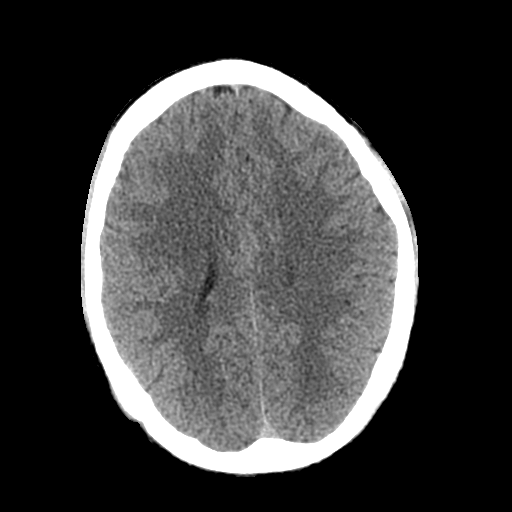
[im 20/31  brain]
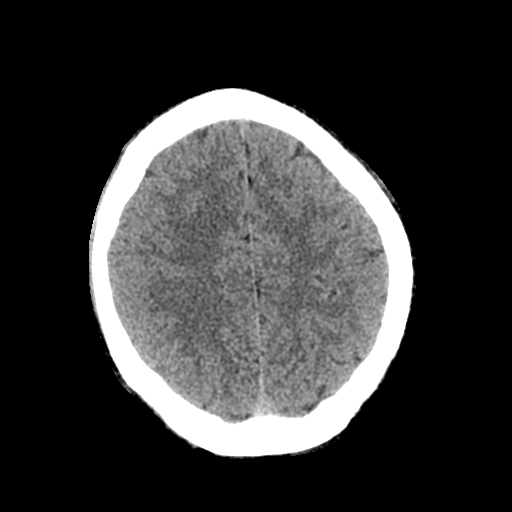
[im 23/31  brain]
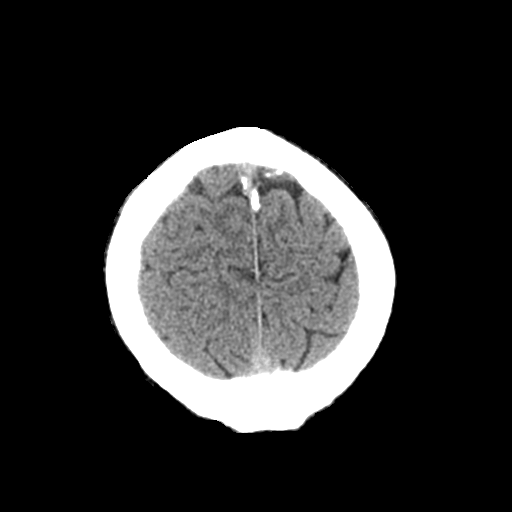
[im 25/31  brain]
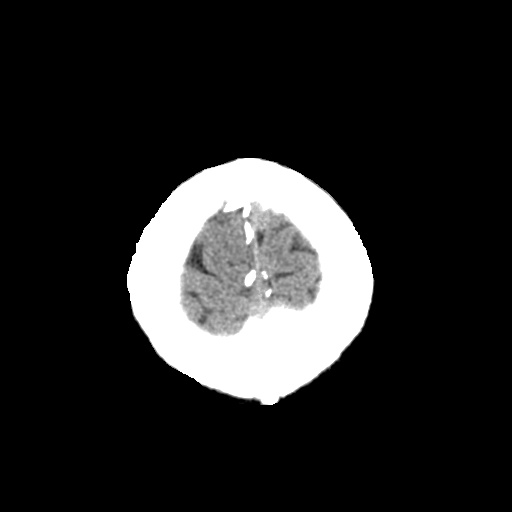
[im 25/31  bone]
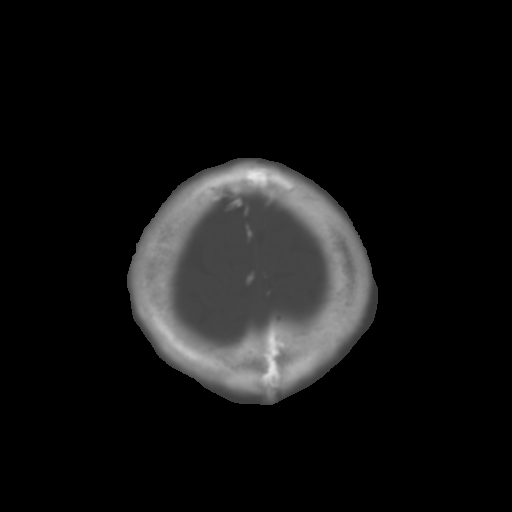
[im 28/31  brain]
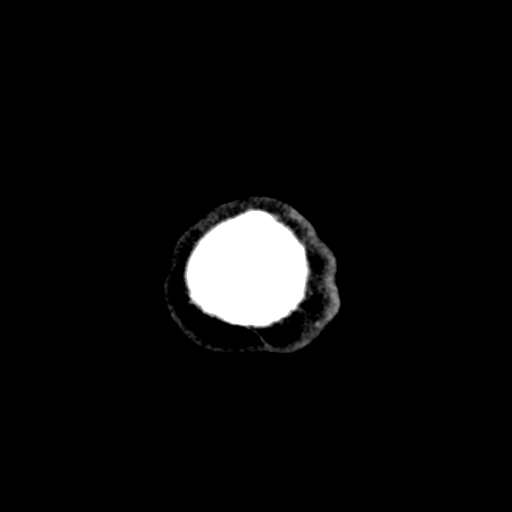

[Series 5: coronal soft tissue · coronal · 0.30mm/px · 3 of 66 slices shown]
[im 22/66  brain]
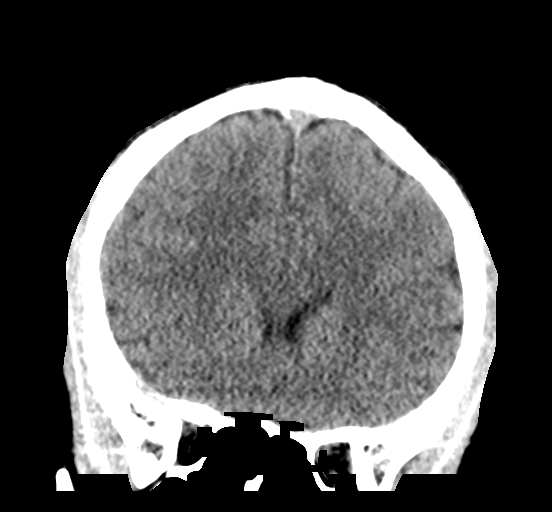
[im 29/66  brain]
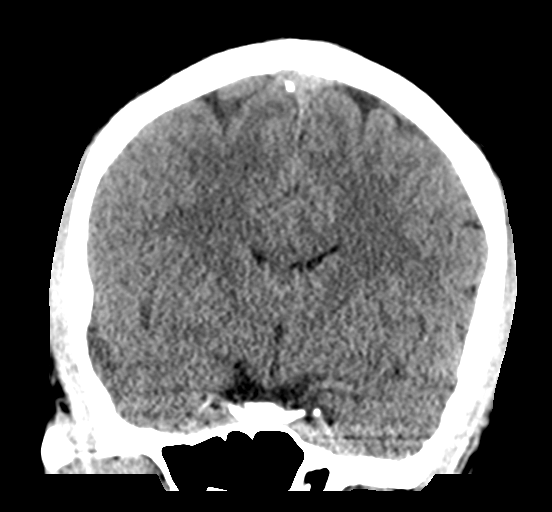
[im 37/66  brain]
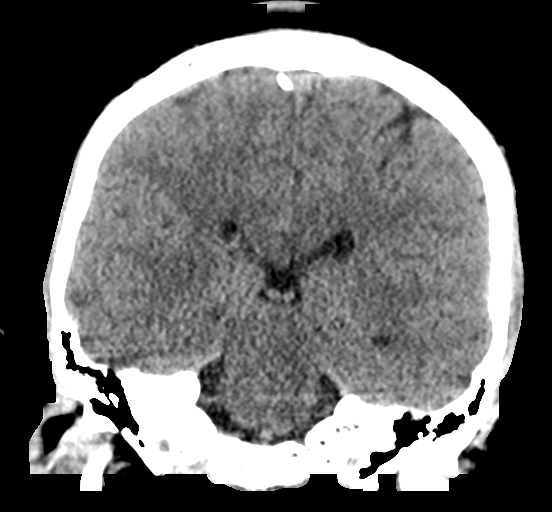

[Series 6: sagittal soft tissue · sagittal · 0.30mm/px · 3 of 56 slices shown]
[im 19/56  brain]
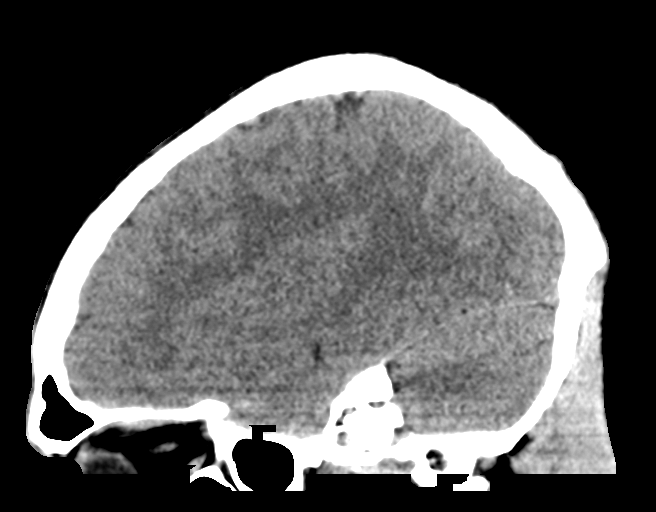
[im 28/56  brain]
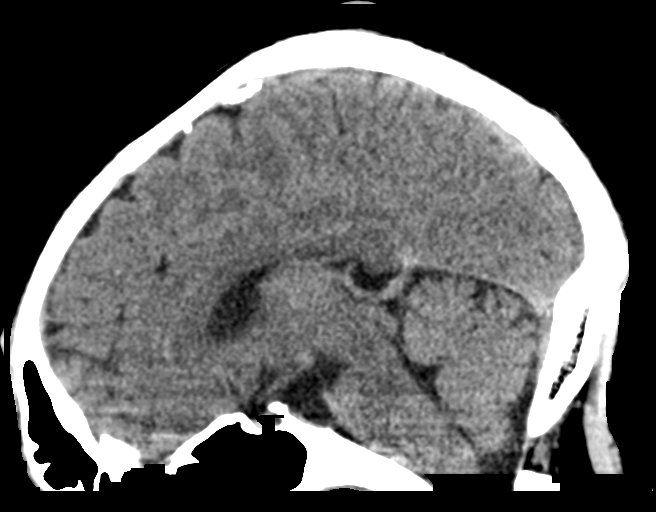
[im 37/56  brain]
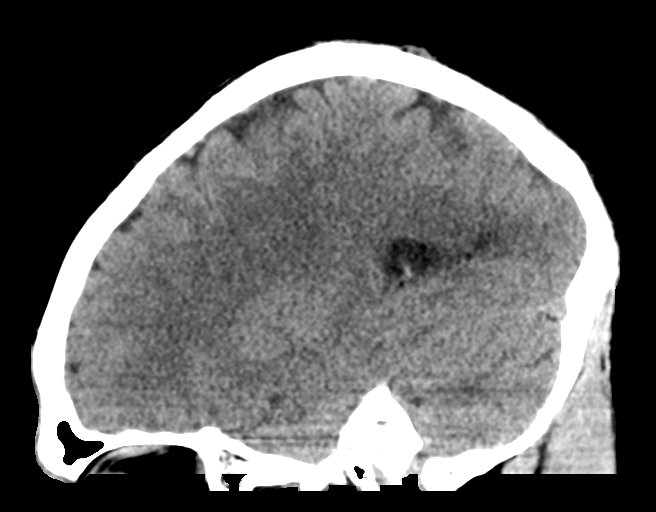

[16 of 47 positions shown; findings below may reference images not displayed]

FINDINGS: Brain: Normal anatomic configuration. No abnormal intra or
extra-axial mass lesion or fluid collection. No abnormal mass effect
or midline shift. No evidence of acute intracranial hemorrhage or
infarct. Ventricular size is normal. Cerebellum unremarkable.

Vascular: Unremarkable

Skull: Intact

Sinuses/Orbits: Paranasal sinuses are clear. Orbits are
unremarkable.

Other: Mastoid air cells and middle ear cavities are clear.
IMPRESSION: Normal exam.  No acute intracranial abnormality.

## 2022-12-29 ENCOUNTER — Inpatient Hospital Stay (HOSPITAL_COMMUNITY)
Admission: EM | Admit: 2022-12-29 | Discharge: 2022-12-31 | DRG: 917 | Disposition: A | Discharge status: Home / Self Care | Payer: Medicaid Other | Attending: Internal Medicine | Admitting: Internal Medicine

## 2022-12-29 ENCOUNTER — Emergency Department (HOSPITAL_COMMUNITY): Payer: Medicaid Other

## 2022-12-29 ENCOUNTER — Encounter (HOSPITAL_COMMUNITY): Payer: Self-pay | Admitting: Internal Medicine

## 2022-12-29 DIAGNOSIS — T40714A Poisoning by cannabis, undetermined, initial encounter: Secondary | ICD-10-CM | POA: Diagnosis present

## 2022-12-29 DIAGNOSIS — Z5900 Homelessness unspecified: Secondary | ICD-10-CM

## 2022-12-29 DIAGNOSIS — E875 Hyperkalemia: Secondary | ICD-10-CM | POA: Diagnosis not present

## 2022-12-29 DIAGNOSIS — J690 Pneumonitis due to inhalation of food and vomit: Secondary | ICD-10-CM

## 2022-12-29 DIAGNOSIS — E860 Dehydration: Secondary | ICD-10-CM | POA: Diagnosis present

## 2022-12-29 DIAGNOSIS — N179 Acute kidney failure, unspecified: Secondary | ICD-10-CM

## 2022-12-29 DIAGNOSIS — F1910 Other psychoactive substance abuse, uncomplicated: Secondary | ICD-10-CM | POA: Diagnosis present

## 2022-12-29 DIAGNOSIS — T17908A Unspecified foreign body in respiratory tract, part unspecified causing other injury, initial encounter: Secondary | ICD-10-CM | POA: Diagnosis present

## 2022-12-29 DIAGNOSIS — T50904A Poisoning by unspecified drugs, medicaments and biological substances, undetermined, initial encounter: Principal | ICD-10-CM

## 2022-12-29 DIAGNOSIS — G928 Other toxic encephalopathy: Secondary | ICD-10-CM | POA: Diagnosis present

## 2022-12-29 DIAGNOSIS — I2489 Other forms of acute ischemic heart disease: Secondary | ICD-10-CM | POA: Diagnosis present

## 2022-12-29 DIAGNOSIS — F1210 Cannabis abuse, uncomplicated: Secondary | ICD-10-CM | POA: Diagnosis present

## 2022-12-29 DIAGNOSIS — R569 Unspecified convulsions: Secondary | ICD-10-CM

## 2022-12-29 DIAGNOSIS — J9601 Acute respiratory failure with hypoxia: Secondary | ICD-10-CM

## 2022-12-29 DIAGNOSIS — E872 Acidosis, unspecified: Secondary | ICD-10-CM | POA: Diagnosis present

## 2022-12-29 DIAGNOSIS — M6282 Rhabdomyolysis: Secondary | ICD-10-CM | POA: Diagnosis not present

## 2022-12-29 DIAGNOSIS — T50901A Poisoning by unspecified drugs, medicaments and biological substances, accidental (unintentional), initial encounter: Secondary | ICD-10-CM | POA: Diagnosis not present

## 2022-12-29 DIAGNOSIS — T405X1A Poisoning by cocaine, accidental (unintentional), initial encounter: Principal | ICD-10-CM | POA: Diagnosis present

## 2022-12-29 DIAGNOSIS — Y92524 Gas station as the place of occurrence of the external cause: Secondary | ICD-10-CM

## 2022-12-29 DIAGNOSIS — F1410 Cocaine abuse, uncomplicated: Secondary | ICD-10-CM | POA: Diagnosis present

## 2022-12-29 HISTORY — DX: Irritability and anger: R45.4

## 2022-12-29 HISTORY — DX: Other psychoactive substance abuse, uncomplicated: F19.10

## 2022-12-29 LAB — CBC WITH DIFFERENTIAL/PLATELET
Abs Immature Granulocytes: 0.01 10*3/uL (ref 0.00–0.07)
Basophils Absolute: 0 10*3/uL (ref 0.0–0.1)
Basophils Relative: 0 %
Eosinophils Absolute: 0 10*3/uL (ref 0.0–0.5)
Eosinophils Relative: 0 %
HCT: 54 % — ABNORMAL HIGH (ref 39.0–52.0)
Hemoglobin: 16.3 g/dL (ref 13.0–17.0)
Immature Granulocytes: 0 %
Lymphocytes Relative: 12 %
Lymphs Abs: 0.5 10*3/uL — ABNORMAL LOW (ref 0.7–4.0)
MCH: 29.6 pg (ref 26.0–34.0)
MCHC: 30.2 g/dL (ref 30.0–36.0)
MCV: 98.2 fL (ref 80.0–100.0)
Monocytes Absolute: 0.2 10*3/uL (ref 0.1–1.0)
Monocytes Relative: 5 %
Neutro Abs: 3.7 10*3/uL (ref 1.7–7.7)
Neutrophils Relative %: 83 %
Platelets: 262 10*3/uL (ref 150–400)
RBC: 5.5 MIL/uL (ref 4.22–5.81)
RDW: 15.2 % (ref 11.5–15.5)
WBC: 4.4 10*3/uL (ref 4.0–10.5)
nRBC: 0 % (ref 0.0–0.2)

## 2022-12-29 LAB — I-STAT ARTERIAL BLOOD GAS, ED
Acid-base deficit: 9 mmol/L — ABNORMAL HIGH (ref 0.0–2.0)
Acid-base deficit: 8 mmol/L — ABNORMAL HIGH (ref 0.0–2.0)
Bicarbonate: 22.8 mmol/L (ref 20.0–28.0)
Bicarbonate: 21.3 mmol/L (ref 20.0–28.0)
Calcium, Ion: 1.2 mmol/L (ref 1.15–1.40)
Calcium, Ion: 1.14 mmol/L — ABNORMAL LOW (ref 1.15–1.40)
HCT: 57 % — ABNORMAL HIGH (ref 39.0–52.0)
HCT: 51 % (ref 39.0–52.0)
Hemoglobin: 19.4 g/dL — ABNORMAL HIGH (ref 13.0–17.0)
Hemoglobin: 17.3 g/dL — ABNORMAL HIGH (ref 13.0–17.0)
O2 Saturation: 67 %
O2 Saturation: 99 %
Patient temperature: 87
Patient temperature: 97.7
Potassium: 5.2 mmol/L — ABNORMAL HIGH (ref 3.5–5.1)
Potassium: 6.9 mmol/L (ref 3.5–5.1)
Sodium: 142 mmol/L (ref 135–145)
Sodium: 140 mmol/L (ref 135–145)
TCO2: 25 mmol/L (ref 22–32)
TCO2: 23 mmol/L (ref 22–32)
pCO2 arterial: 55.7 mmHg — ABNORMAL HIGH (ref 32–48)
pCO2 arterial: 57 mmHg — ABNORMAL HIGH (ref 32–48)
pH, Arterial: 7.18 — CL (ref 7.35–7.45)
pH, Arterial: 7.179 — CL (ref 7.35–7.45)
pO2, Arterial: 31 mmHg — CL (ref 83–108)
pO2, Arterial: 151 mmHg — ABNORMAL HIGH (ref 83–108)

## 2022-12-29 LAB — RAPID URINE DRUG SCREEN, HOSP PERFORMED
Amphetamines: NOT DETECTED
Barbiturates: NOT DETECTED
Benzodiazepines: NOT DETECTED
Cocaine: POSITIVE — AB
Opiates: NOT DETECTED
Tetrahydrocannabinol: POSITIVE — AB

## 2022-12-29 LAB — COMPREHENSIVE METABOLIC PANEL
ALT: 40 U/L (ref 0–44)
AST: 81 U/L — ABNORMAL HIGH (ref 15–41)
Albumin: 4.2 g/dL (ref 3.5–5.0)
Alkaline Phosphatase: 66 U/L (ref 38–126)
Anion gap: 15 (ref 5–15)
BUN: 28 mg/dL — ABNORMAL HIGH (ref 6–20)
CO2: 20 mmol/L — ABNORMAL LOW (ref 22–32)
Calcium: 8.4 mg/dL — ABNORMAL LOW (ref 8.9–10.3)
Chloride: 102 mmol/L (ref 98–111)
Creatinine, Ser: 1.37 mg/dL — ABNORMAL HIGH (ref 0.61–1.24)
GFR, Estimated: 34 mL/min — ABNORMAL LOW (ref >60)
Glucose, Bld: 103 mg/dL — ABNORMAL HIGH (ref 70–99)
Potassium: 4.8 mmol/L (ref 3.5–5.1)
Sodium: 137 mmol/L (ref 135–145)
Total Bilirubin: 0.8 mg/dL (ref 0.3–1.2)
Total Protein: 7.8 g/dL (ref 6.5–8.1)

## 2022-12-29 LAB — TROPONIN I (HIGH SENSITIVITY)
Troponin I (High Sensitivity): 257 ng/L (ref <18)
Troponin I (High Sensitivity): 343 ng/L (ref <18)

## 2022-12-29 LAB — ACETAMINOPHEN LEVEL: Acetaminophen (Tylenol), Serum: <10 ug/mL — ABNORMAL LOW (ref 10–30)

## 2022-12-29 LAB — LACTIC ACID, PLASMA
Lactic Acid, Venous: 4.2 mmol/L (ref 0.5–1.9)
Lactic Acid, Venous: 3.8 mmol/L (ref 0.5–1.9)

## 2022-12-29 LAB — BRAIN NATRIURETIC PEPTIDE: B Natriuretic Peptide: 427 pg/mL — ABNORMAL HIGH (ref 0.0–100.0)

## 2022-12-29 LAB — CK: Total CK: 9349 U/L — ABNORMAL HIGH (ref 49–397)

## 2022-12-29 LAB — PROCALCITONIN: Procalcitonin: 2.53 ng/mL

## 2022-12-29 LAB — SALICYLATE LEVEL: Salicylate Lvl: <7 mg/dL — ABNORMAL LOW (ref 7.0–30.0)

## 2022-12-29 LAB — ETHANOL: Alcohol, Ethyl (B): <10 mg/dL (ref <10)

## 2022-12-29 MED ORDER — ONDANSETRON HCL 4 MG/2ML IJ SOLN: 4.0000 mg | Freq: Four times a day (QID) | INTRAMUSCULAR | Status: DC | PRN

## 2022-12-29 MED ORDER — SODIUM CHLORIDE 0.9% FLUSH
3.0000 mL | Freq: Two times a day (BID) | INTRAVENOUS | Status: DC
  Administered 2022-12-29 – 2022-12-31 (×2): 3 mL via INTRAVENOUS

## 2022-12-29 MED ORDER — ACETAMINOPHEN 650 MG RE SUPP: 650.0000 mg | Freq: Four times a day (QID) | RECTAL | Status: DC | PRN

## 2022-12-29 MED ORDER — SODIUM CHLORIDE 0.9 % IV SOLN
3.0000 g | Freq: Once | INTRAVENOUS | Status: AC
  Administered 2022-12-29: 3 g via INTRAVENOUS
  Filled 2022-12-29: qty 8

## 2022-12-29 MED ORDER — NALOXONE HCL 0.4 MG/ML IJ SOLN
0.1000 mg | Freq: Once | INTRAMUSCULAR | Status: AC
  Administered 2022-12-29: 0.1 mg via INTRAVENOUS
  Filled 2022-12-29: qty 1

## 2022-12-29 MED ORDER — POLYETHYLENE GLYCOL 3350 17 G PO PACK: 17.0000 g | PACK | Freq: Every day | ORAL | Status: DC | PRN

## 2022-12-29 MED ORDER — ACETAMINOPHEN 325 MG PO TABS
650.0000 mg | ORAL_TABLET | Freq: Four times a day (QID) | ORAL | Status: DC | PRN
  Administered 2022-12-30: 650 mg via ORAL
  Filled 2022-12-29: qty 2

## 2022-12-29 MED ORDER — HYDRALAZINE HCL 20 MG/ML IJ SOLN: 5.0000 mg | INTRAMUSCULAR | Status: DC | PRN

## 2022-12-29 MED ORDER — BISACODYL 5 MG PO TBEC: 5.0000 mg | DELAYED_RELEASE_TABLET | Freq: Every day | ORAL | Status: DC | PRN

## 2022-12-29 MED ORDER — ENOXAPARIN SODIUM 40 MG/0.4ML IJ SOSY
40.0000 mg | PREFILLED_SYRINGE | INTRAMUSCULAR | Status: DC
  Administered 2022-12-29 – 2022-12-30 (×2): 40 mg via SUBCUTANEOUS
  Filled 2022-12-29 (×2): qty 0.4

## 2022-12-29 MED ORDER — DEXMEDETOMIDINE HCL IN NACL 400 MCG/100ML IV SOLN: 0.0000 ug/kg/h | INTRAVENOUS | Status: DC

## 2022-12-29 MED ORDER — SODIUM CHLORIDE 0.9 % IV SOLN: 3.0000 g | Freq: Four times a day (QID) | INTRAVENOUS | Status: DC

## 2022-12-29 MED ORDER — ONDANSETRON HCL 4 MG/2ML IJ SOLN
4.0000 mg | Freq: Once | INTRAMUSCULAR | Status: AC
  Administered 2022-12-29: 4 mg via INTRAVENOUS
  Filled 2022-12-29: qty 2

## 2022-12-29 MED ORDER — NALOXONE HCL 0.4 MG/ML IJ SOLN: 0.2000 mg | Freq: Once | INTRAMUSCULAR | Status: DC

## 2022-12-29 MED ORDER — LACTATED RINGERS IV SOLN: INTRAVENOUS | Status: DC

## 2022-12-29 MED ORDER — SODIUM CHLORIDE 0.9 % IV BOLUS (SEPSIS)
1000.0000 mL | Freq: Once | INTRAVENOUS | Status: AC
  Administered 2022-12-29: 1000 mL via INTRAVENOUS

## 2022-12-29 MED ORDER — LEVETIRACETAM IN NACL 1000 MG/100ML IV SOLN
1000.0000 mg | Freq: Once | INTRAVENOUS | Status: AC
  Administered 2022-12-29: 1000 mg via INTRAVENOUS
  Filled 2022-12-29: qty 100

## 2022-12-29 MED ORDER — ONDANSETRON HCL 4 MG PO TABS: 4.0000 mg | ORAL_TABLET | Freq: Four times a day (QID) | ORAL | Status: DC | PRN

## 2022-12-29 NOTE — ED Notes (Signed)
Pt able to tolerate small amount of water

## 2022-12-29 NOTE — ED Provider Notes (Addendum)
[place] EMERGENCY DEPARTMENT AT [facility] Provider Note   CSN: [id number] Arrival date & time: 12/29/22  1117     History  Chief Complaint  Patient presents with   Drug Overdose    Mark Harvey is a 32 y.o. male.  Patient with unknown history or age who appears to be mid 30s brought in by EMS and paramedic team for drug overdose.  He was found to have a baggy of greenish powdery material unknown usage but suspected IV drug use.  A passerby at a gas station found patient altered confused on the ground called EMS.  They started bystander CPR for unresponsiveness.  Unclear if patient had no pulse however noted to be hypoxic in the 60s very cool to the touch and had some response to intranasal Narcan 0.5 mg.  Initial GCS was 5, repeat GCS after Narcan was 10.  Patient noted to have rhonchi in bilateral lungs.  Started on nonrebreather.  Patient altered able to provide name on exam. Otherwise not answering many other questions but continuously asking for water and complaining of shortness of breath.   Drug Overdose       Home Medications Prior to Admission medications   Not on File      Allergies    Patient has no allergy information on record.    Review of Systems   Review of Systems  Physical Exam Updated Vital Signs BP (!) 116/93   Pulse 96   Temp 97.7 F (36.5 C) (Axillary)   Resp 19   Ht 6' (1.829 m)   Wt 81.6 kg   SpO2 98%   BMI 24.41 kg/m  Physical Exam Constitutional: awake, eyes open, confused, mumbling words pulling at NRB mask asking for water Eyes: Conjunctivae are normal. Pinpoint pupils bilaterally ENT      Head: Normocephalic and atraumatic.      Mouth/Throat: Mucous membranes are moist. Inferior lip bites no hemorrhage but associated swelling present      Neck: No stridor. Cardiovascular: S1, S2,  RRR, equal distal DP pulses Respiratory: Hypoxic 60s on RA, bilateral rhonchi and rales Gastrointestinal: Soft and  atraumatic Musculoskeletal: no edema of LEs Neurologic: awake, eyes open, confused, mumbling words pulling at NRB mask asking for water, moving extremities x 4 Skin: Skin is cool, dry Psychiatric: intoxicated   ED Results / Procedures / Treatments   Labs (all labs ordered are listed, but only abnormal results are displayed) Labs Reviewed  COMPREHENSIVE METABOLIC PANEL - Abnormal; Notable for the following components:      Result Value   CO2 20 (*)    Glucose, Bld 103 (*)    BUN 28 (*)    Creatinine, Ser 1.37 (*)    Calcium 8.4 (*)    AST 81 (*)    GFR, Estimated 34 (*)    All other components within normal limits  CBC WITH DIFFERENTIAL/PLATELET - Abnormal; Notable for the following components:   HCT 54.0 (*)    Lymphs Abs 0.5 (*)    All other components within normal limits  BRAIN NATRIURETIC PEPTIDE - Abnormal; Notable for the following components:   B Natriuretic Peptide 427.0 (*)    All other components within normal limits  SALICYLATE LEVEL - Abnormal; Notable for the following components:   Salicylate Lvl <7.0 (*)    All other components within normal limits  ACETAMINOPHEN LEVEL - Abnormal; Notable for the following components:   Acetaminophen (Tylenol), Serum <10 (*)    All other components  within normal limits  LACTIC ACID, PLASMA - Abnormal; Notable for the following components:   Lactic Acid, Venous 4.2 (*)    All other components within normal limits  LACTIC ACID, PLASMA - Abnormal; Notable for the following components:   Lactic Acid, Venous 3.8 (*)    All other components within normal limits  RAPID URINE DRUG SCREEN, HOSP PERFORMED - Abnormal; Notable for the following components:   Cocaine POSITIVE (*)    Tetrahydrocannabinol POSITIVE (*)    All other components within normal limits  I-STAT ARTERIAL BLOOD GAS, ED - Abnormal; Notable for the following components:   pH, Arterial 7.180 (*)    pCO2 arterial 55.7 (*)    pO2, Arterial 31 (*)    Acid-base deficit  9.0 (*)    Potassium 5.2 (*)    HCT 57.0 (*)    Hemoglobin 19.4 (*)    All other components within normal limits  I-STAT ARTERIAL BLOOD GAS, ED - Abnormal; Notable for the following components:   pH, Arterial 7.179 (*)    pCO2 arterial 57.0 (*)    pO2, Arterial 151 (*)    Acid-base deficit 8.0 (*)    Potassium 6.9 (*)    Calcium, Ion 1.14 (*)    Hemoglobin 17.3 (*)    All other components within normal limits  TROPONIN I (HIGH SENSITIVITY) - Abnormal; Notable for the following components:   Troponin I (High Sensitivity) 257 (*)    All other components within normal limits  ETHANOL  CK  TROPONIN I (HIGH SENSITIVITY)    EKG EKG Interpretation  Date/Time:  Friday Dec 29 2022 12:16:31 EDT Ventricular Rate:  96 PR Interval:  116 QRS Duration: 175 QT Interval:  376 QTC Calculation: 476 R Axis:   85 Text Interpretation: Sinus rhythm Borderline short PR interval Right atrial enlargement Right bundle branch block Favor early repolarization and J point elevation anterior leads, no reciprocal changes Confirmed by Vivien Rossetti ([id number]) on 12/29/2022 12:47:20 PM  Radiology CT Head Wo Contrast  Result Date: 12/29/2022 CLINICAL DATA:  Possible seizure, overdose. EXAM: CT HEAD WITHOUT CONTRAST CT CERVICAL SPINE WITHOUT CONTRAST TECHNIQUE: Multidetector CT imaging of the head and cervical spine was performed following the standard protocol without intravenous contrast. Multiplanar CT image reconstructions of the cervical spine were also generated. RADIATION DOSE REDUCTION: This exam was performed according to the departmental dose-optimization program which includes automated exposure control, adjustment of the mA and/or kV according to patient size and/or use of iterative reconstruction technique. COMPARISON:  None Available. FINDINGS: CT HEAD FINDINGS Brain: No acute intracranial hemorrhage. Gray-white differentiation is preserved. No hydrocephalus or extra-axial collection. No mass effect  or midline shift. Vascular: No hyperdense vessel or unexpected calcification. Skull: No calvarial fracture or suspicious bone lesion. Skull base is unremarkable. Sinuses/Orbits: Unremarkable. Other: None. CT CERVICAL SPINE FINDINGS Alignment: Normal. Skull base and vertebrae: Mild motion degradation through the C5-6 level. Within this limitation, no acute fracture. Normal craniocervical junction. No suspicious bone lesions. Soft tissues and spinal canal: No prevertebral fluid or swelling. No visible canal hematoma. Disc levels:  No significant degenerative change. Upper chest: Patchy ground-glass opacities in the right-greater-than-left upper lobes may reflect aspiration or infection. Other: None. IMPRESSION: 1. No acute intracranial abnormality. 2. No acute cervical spine fracture. 3. Patchy ground-glass opacities in the right-greater-than-left upper lobes may reflect aspiration or infection. Electronically Signed   By: Orvan Falconer M.D.   On: 12/29/2022 14:33   CT Cervical Spine Wo Contrast  Result Date: 12/29/2022  CLINICAL DATA:  Possible seizure, overdose. EXAM: CT HEAD WITHOUT CONTRAST CT CERVICAL SPINE WITHOUT CONTRAST TECHNIQUE: Multidetector CT imaging of the head and cervical spine was performed following the standard protocol without intravenous contrast. Multiplanar CT image reconstructions of the cervical spine were also generated. RADIATION DOSE REDUCTION: This exam was performed according to the departmental dose-optimization program which includes automated exposure control, adjustment of the mA and/or kV according to patient size and/or use of iterative reconstruction technique. COMPARISON:  None Available. FINDINGS: CT HEAD FINDINGS Brain: No acute intracranial hemorrhage. Gray-white differentiation is preserved. No hydrocephalus or extra-axial collection. No mass effect or midline shift. Vascular: No hyperdense vessel or unexpected calcification. Skull: No calvarial fracture or suspicious  bone lesion. Skull base is unremarkable. Sinuses/Orbits: Unremarkable. Other: None. CT CERVICAL SPINE FINDINGS Alignment: Normal. Skull base and vertebrae: Mild motion degradation through the C5-6 level. Within this limitation, no acute fracture. Normal craniocervical junction. No suspicious bone lesions. Soft tissues and spinal canal: No prevertebral fluid or swelling. No visible canal hematoma. Disc levels:  No significant degenerative change. Upper chest: Patchy ground-glass opacities in the right-greater-than-left upper lobes may reflect aspiration or infection. Other: None. IMPRESSION: 1. No acute intracranial abnormality. 2. No acute cervical spine fracture. 3. Patchy ground-glass opacities in the right-greater-than-left upper lobes may reflect aspiration or infection. Electronically Signed   By: Orvan Falconer M.D.   On: 12/29/2022 14:33   DG Chest Portable 1 View  Result Date: 12/29/2022 CLINICAL DATA:  Aspiration. EXAM: PORTABLE CHEST 1 VIEW COMPARISON:  None Available. FINDINGS: No pneumothorax, effusion or edema. Normal cardiopericardial silhouette. Overlapping cardiac leads. There is right perihilar and lung base parenchymal opacity. Infiltrates possible. Recommend follow-up IMPRESSION: Right perihilar and medial lung base opacity. Possible infiltrate. Aspiration would be in the differential. Recommend follow-up. Electronically Signed   By: Karen Kays M.D.   On: 12/29/2022 12:59    Procedures .Critical Care  Performed by: Mardene Sayer, MD Authorized by: Mardene Sayer, MD   Critical care provider statement:    Critical care time (minutes):  75   Critical care was necessary to treat or prevent imminent or life-threatening deterioration of the following conditions:  Respiratory failure and toxidrome   Critical care was time spent personally by me on the following activities:  Development of treatment plan with patient or surrogate, evaluation of patient's response to treatment,  examination of patient, ordering and review of laboratory studies, ordering and review of radiographic studies, ordering and performing treatments and interventions, pulse oximetry, re-evaluation of patient's condition, review of old charts and obtaining history from patient or surrogate     Medications Ordered in ED Medications  dexmedetomidine (PRECEDEX) 400 MCG/100ML (4 mcg/mL) infusion (has no administration in time range)  Ampicillin-Sulbactam (UNASYN) 3 g in sodium chloride 0.9 % 100 mL IVPB (0 g Intravenous Stopped 12/29/22 1250)  ondansetron (ZOFRAN) injection 4 mg (4 mg Intravenous Given 12/29/22 1158)  naloxone (NARCAN) injection 0.1 mg (0.1 mg Intravenous Given 12/29/22 1155)  levETIRAcetam (KEPPRA) IVPB 1000 mg/100 mL premix (0 mg Intravenous Stopped 12/29/22 1234)  sodium chloride 0.9 % bolus 1,000 mL (1,000 mLs Intravenous New Bag/Given 12/29/22 1345)    ED Course/ Medical Decision Making/ A&P Clinical Course as of 12/29/22 1507  Fri Dec 29, 2022  1424 S/w Dr Melton Alar who is also in agreement that this is likely all demand ischemia in the setting of likely being found down today and hypoxic respiratory failure.  She has looked at patient's EKG and would  not call STEMI. [VB]    Clinical Course User Index [VB] Mardene Sayer, MD                             Medical Decision Making [place] Xxx Doe is a 32 y.o. male.  Patient with unknown history or age who appears to be mid 30s brought in by EMS and paramedic team for drug overdose.  He was found to have a baggy of greenish powdery material unknown usage but suspected IV drug use.    Initial GCS 5 with EMS repeat GCS 10 after intranasal Narcan 0.5 mg.  Initial VS notable for hypothermia and hypoxia.  Patient arrives altered awake not answering questions but mumbling words with obvious lip bite on inferior lip, bilateral rales and rhonchi satting 70s on room air with pinpoint pupils bilaterally.  No evidence of obvious head  trauma.  Patient was covered with IV Unasyn for concern for multifocal aspiration pneumonia.  He was hypoxic to 60s on room air improving with nonrebreather however still working hard to breathe so started on BiPAP.  Started on warm IV fluids and Bair hugger for hypothermia likely related to being found down.  He did have a bite mark on his lip concerning for possible seizure or related to syncope.  Covered with 1 dose of IV Keppra. chest x-ray reviewed by me notable for significant infiltrates on right concerning for aspiration.  Patient awake on exam but confused encephalopathic of seeming to be still intoxicated so given further IV Narcan but small doses to prevent severe agitation.  CT head obtained which I personally reviewed no evidence of ICH or acute traumatic injury.  Initial labs concerning for a lactate of 4.2 as well as elevated BNP 427 and troponin 257 with pH of 7.18 and initial pO2 31 all likely related to drug intoxication, aspiration event, severe hypoxia.  UDS positive for cocaine and THC.  EKG with early repolarization changes but no evidence of STEMI.S/w Dr Melton Alar who is also in agreement that this is likely all demand ischemia in the setting of likely being found down today and hypoxic respiratory failure.  She has looked at patient's EKG and would not call STEMI.  Patient improved with BiPAP and supportive intervention but discussed with hospitalist Dr. Ophelia Charter for continued supportive care.  Amount and/or Complexity of Data Reviewed Labs: ordered. Radiology: ordered.  Risk Prescription drug management. Decision regarding hospitalization.     Final Clinical Impression(s) / ED Diagnoses Final diagnoses:  Drug overdose of undetermined intent, initial encounter  Acute respiratory failure with hypoxia (HCC)  AKI (acute kidney injury) (HCC)  Aspiration pneumonia of right middle lobe, unspecified aspiration pneumonia type [facility])    Rx / DC Orders ED Discharge Orders      None         Mardene Sayer, MD 12/29/22 1507    Mardene Sayer, MD 12/29/22 [id number]

## 2022-12-29 NOTE — ED Triage Notes (Signed)
[place] via ems with a CC of overdose. [place] the woods cold to the touch,  unresponsive. EMS gave .5 mg of narcan and pt became slight more responsive. Pt required to be ventilated in field, and was eventually transitioned to a NRB. Pt currently has a GCS of 10. Not maintaining airway

## 2022-12-29 NOTE — Progress Notes (Signed)
Pt transported to CT and back to ED room 21 on BiPAP without any complications.

## 2022-12-29 NOTE — Progress Notes (Signed)
Pharmacy Antibiotic Note  Delwin Hundt is a 32 y.o. male admitted on 12/29/2022 with pneumonia.  Pharmacy has been consulted for Unasyn dosing.  Saw is presenting today after a suspected overdose. He was revived with narcan in the field. Pharmacy to dose Unasyn to prevent aspiration pneumonia.   Plan: Start Unasyn 3g q6 hours  Follow up signs and symptoms of infection, renal function, culture, and ability to narrow.    Height: 6' (182.9 cm) Weight: 81.6 kg (180 lb) IBW/kg (Calculated) : 77.6  Temp (24hrs), Avg:92.5 F (33.6 C), Min:87.2 F (30.7 C), Max:97.7 F (36.5 C)  Recent Labs  Lab 12/29/22 1230 12/29/22 1339  WBC 4.4  --   CREATININE 1.37*  --   LATICACIDVEN 4.2* 3.8*    Estimated Creatinine Clearance: 87.3 mL/min (A) (by C-G formula based on SCr of 1.37 mg/dL (H)).    Not on File  Antimicrobials this admission:   Dose adjustments this admission:   Microbiology results:  BCx:   UCx:    Sputum:    MRSA PCR:   Thank you for allowing pharmacy to be a part of this patient's care.  Blane Ohara, PharmD  PGY1 Pharmacy Resident

## 2022-12-29 NOTE — Progress Notes (Signed)
   12/29/22 1259  BiPAP/CPAP/SIPAP  $ Non-Invasive Ventilator  Non-Invasive Vent Set Up;Non-Invasive Vent Initial  $ Face Mask Medium Yes  BiPAP/CPAP/SIPAP Pt Type Adult  BiPAP/CPAP/SIPAP SERVO ((Air))  Mask Type Full face mask  Mask Size Medium  Set Rate 10 breaths/min  Respiratory Rate 25 breaths/min  IPAP 105 cmH20  FiO2 (%) 100 %  Minute Ventilation 13.8  Leak 11  Peak Inspiratory Pressure (PIP) 14  Tidal Volume (Vt) 539  Patient Home Equipment No  Auto Titrate No  Press High Alarm 25 cmH2O  BiPAP/CPAP /SiPAP Vitals  Pulse Rate 99  Resp (!) 25  SpO2 92 %  Bilateral Breath Sounds Clear;Diminished  MEWS Score/Color  MEWS Score 3  MEWS Score Color Yellow

## 2022-12-29 NOTE — ED Notes (Signed)
Pt swallow screen normal

## 2022-12-29 NOTE — ED Notes (Signed)
Pt resting comfortably at this time.

## 2022-12-29 NOTE — H&P (Signed)
History and Physical    Patient: Mark Harvey ZOX:[id number] DOB: 08/24/1990 DOA: 12/29/2022 DOS: the patient was seen and examined on 12/29/2022 PCP: Pcp, No  Patient coming from: Homeless - lives with friends; NOK: None   Chief Complaint: Overdose  HPI: Mark Harvey is a 32 y.o. male with unknown medical history presenting with overdose.  He appears to have had a similar episode on 07/06/21.  He was found down at a gas station earlier this AM.  He initially reported no drugs and subsequently agreed that he snorted cocaine and smoked THC.  He denied other drugs but then acknowledged a baggie of a green substance that was other drugs (NOS).  He does have mouth/tongue trauma.  He previously passed a swallow evaluation but was placed on BIPAP.  He reports being more awake, feels like he can come off BIPAP (subsequently removed).  He denies being homeless, reports sleeping at various people's houses, declines having me call family.  Denies SI/HI.    ER Course:  ?[name], ?30s.  Did a concoction of drugs, greenish substance.  Found down outside gas station.  Bystander CPR but had a pulse with EMS.  Awakes, somewhat confused, on BIPAP.  Narcan not particularly helpful.  +THC, cocaine, maybe tranquilizer/opiates.  CT with B opacites, given Unasyn.  Cards reviewed EKG, thinks demand ischemia.     Review of Systems: As mentioned in the history of present illness. All other systems reviewed and are negative.  Past Medical History:  Diagnosis Date   Difficulty controlling anger    Polysubstance abuse (HCC)    History reviewed. No pertinent surgical history. Social History:  reports that he does not currently use alcohol. He reports current drug use. Drugs: "Crack" cocaine, Cocaine, and Marijuana. No history on file for tobacco use.  Not on File  History reviewed. No pertinent family history.  Prior to Admission medications   Not on File    Physical Exam: Vitals:   12/29/22 1345  12/29/22 1432 12/29/22 1508 12/29/22 1540  BP: (!) 116/93   108/75  Pulse: 96  (!) 105 95  Resp: 19  18 18   Temp:  97.7 F (36.5 C)    TempSrc:  Axillary    SpO2: 98%  93% 93%  Weight:      Height:       General:  Appears calm and comfortable and is in NAD, mildly confused, BIPAP removed during my evaluation Eyes:   EOMI, normal lids, iris ENT:  grossly normal hearing, mild trauma to lips & tongue, mmm Neck:  no LAD, masses or thyromegaly Cardiovascular:  RRR, no m/r/g. No LE edema.  Respiratory:   CTA bilaterally with no wheezes/rales/rhonchi.  Normal respiratory effort. Abdomen:  soft, NT, ND Skin:  no rash or induration seen on limited exam Musculoskeletal:  grossly normal tone BUE/BLE, good ROM, no bony abnormality Psychiatric:  mildly confused mood and affect, speech fluent and appropriate, AOx3 Neurologic:  CN 2-12 grossly intact, moves all extremities in coordinated fashion   Radiological Exams on Admission: Independently reviewed - see discussion in A/P where applicable  CT Head Wo Contrast  Result Date: 12/29/2022 CLINICAL DATA:  Possible seizure, overdose. EXAM: CT HEAD WITHOUT CONTRAST CT CERVICAL SPINE WITHOUT CONTRAST TECHNIQUE: Multidetector CT imaging of the head and cervical spine was performed following the standard protocol without intravenous contrast. Multiplanar CT image reconstructions of the cervical spine were also generated. RADIATION DOSE REDUCTION: This exam was performed according to the departmental dose-optimization program which includes automated  exposure control, adjustment of the mA and/or kV according to patient size and/or use of iterative reconstruction technique. COMPARISON:  None Available. FINDINGS: CT HEAD FINDINGS Brain: No acute intracranial hemorrhage. Gray-white differentiation is preserved. No hydrocephalus or extra-axial collection. No mass effect or midline shift. Vascular: No hyperdense vessel or unexpected calcification. Skull: No  calvarial fracture or suspicious bone lesion. Skull base is unremarkable. Sinuses/Orbits: Unremarkable. Other: None. CT CERVICAL SPINE FINDINGS Alignment: Normal. Skull base and vertebrae: Mild motion degradation through the C5-6 level. Within this limitation, no acute fracture. Normal craniocervical junction. No suspicious bone lesions. Soft tissues and spinal canal: No prevertebral fluid or swelling. No visible canal hematoma. Disc levels:  No significant degenerative change. Upper chest: Patchy ground-glass opacities in the right-greater-than-left upper lobes may reflect aspiration or infection. Other: None. IMPRESSION: 1. No acute intracranial abnormality. 2. No acute cervical spine fracture. 3. Patchy ground-glass opacities in the right-greater-than-left upper lobes may reflect aspiration or infection. Electronically Signed   By: Orvan Falconer M.D.   On: 12/29/2022 14:33   CT Cervical Spine Wo Contrast  Result Date: 12/29/2022 CLINICAL DATA:  Possible seizure, overdose. EXAM: CT HEAD WITHOUT CONTRAST CT CERVICAL SPINE WITHOUT CONTRAST TECHNIQUE: Multidetector CT imaging of the head and cervical spine was performed following the standard protocol without intravenous contrast. Multiplanar CT image reconstructions of the cervical spine were also generated. RADIATION DOSE REDUCTION: This exam was performed according to the departmental dose-optimization program which includes automated exposure control, adjustment of the mA and/or kV according to patient size and/or use of iterative reconstruction technique. COMPARISON:  None Available. FINDINGS: CT HEAD FINDINGS Brain: No acute intracranial hemorrhage. Gray-white differentiation is preserved. No hydrocephalus or extra-axial collection. No mass effect or midline shift. Vascular: No hyperdense vessel or unexpected calcification. Skull: No calvarial fracture or suspicious bone lesion. Skull base is unremarkable. Sinuses/Orbits: Unremarkable. Other: None. CT  CERVICAL SPINE FINDINGS Alignment: Normal. Skull base and vertebrae: Mild motion degradation through the C5-6 level. Within this limitation, no acute fracture. Normal craniocervical junction. No suspicious bone lesions. Soft tissues and spinal canal: No prevertebral fluid or swelling. No visible canal hematoma. Disc levels:  No significant degenerative change. Upper chest: Patchy ground-glass opacities in the right-greater-than-left upper lobes may reflect aspiration or infection. Other: None. IMPRESSION: 1. No acute intracranial abnormality. 2. No acute cervical spine fracture. 3. Patchy ground-glass opacities in the right-greater-than-left upper lobes may reflect aspiration or infection. Electronically Signed   By: Orvan Falconer M.D.   On: 12/29/2022 14:33   DG Chest Portable 1 View  Result Date: 12/29/2022 CLINICAL DATA:  Aspiration. EXAM: PORTABLE CHEST 1 VIEW COMPARISON:  None Available. FINDINGS: No pneumothorax, effusion or edema. Normal cardiopericardial silhouette. Overlapping cardiac leads. There is right perihilar and lung base parenchymal opacity. Infiltrates possible. Recommend follow-up IMPRESSION: Right perihilar and medial lung base opacity. Possible infiltrate. Aspiration would be in the differential. Recommend follow-up. Electronically Signed   By: Karen Kays M.D.   On: 12/29/2022 12:59    EKG: Independently reviewed.  NSR with rate 96; nonspecific ST changes with no evidence of acute ischemia   Labs on Admission: I have personally reviewed the available labs and imaging studies at the time of the admission.  Pertinent labs:    7.18/55.02/27/86% -> 7.179/57/151 BUN 28/Creatinine 1.37/GFR 34 AST 81 BNP 427 HS troponin 257 Lactate 4.2, 3.8 Unremarkable CBC ETOH <10 ASA <7 UDS + cocaine, THC   Assessment and Plan: Principal Problem:   Accidental overdose, initial encounter Active Problems:  Seizure-like activity (HCC)   Aspiration into respiratory tract    Polysubstance abuse (HCC)   AKI (acute kidney injury) (HCC)    Accidental overdose -Patient with apparent accidental overdose, similar to presentation in 06/2021 -He is not entirely clear on what he used, acknowledges at least cocaine and THC -There was concern for seizure activity as well as aspiration -Will observe on progressive care for now -Initially on BIPAP, currently off -Acidosis on presentation -Appears to be improving but will monitor closely for ongoing cognitive clearing  Possible seizure -Tongue trauma, elevated lactate in the setting of overdose -No known h/o seizures -Loaded with Keppra -Will monitor without further AEDs -If recurrent concerns, will request EEG and neurology consult  Probable aspiration -History suggestive of aspiration with apparent overdose, lactic acidosis -Imaging also suggestive of aspiration -He required BIPAP on presentation -He does not show obvious evidence of infection so it is possible that this is an aspiration pneumonitis rather than a true PNA -Will not cover with antibiotics at this time, as a retrospective analysis of antibiotics vs. Supportive care in treatment of aspiration indicated no different in hospital mortality or ICU transfers but more antibiotic escalation.  The patient is generally asymptomatic at this time. -Will order lower respiratory tract procalcitonin level.   >0.5 indicates infection and >>0.5 indicates more serious disease.  As the procalcitonin level normalizes, it will be reasonable to consider de-escalation of antibiotic coverage.  The sensitivity of procalcitonin is variable and should not be used alone to guide treatment.  Polysubstance abuse -UDS positive for cocaine, THC -Patient was found with a bag containing at least one other substance not matching the description for either of the acknowledged substances, and appears to agree that there may have been other unspecified drugs involved -Cessation  encouraged -TOC team consulted  AKI -Likely associated with dehydration in the setting of polysubstance abuse -Will hydrate and follow    Advance Care Planning:   Code Status: Full Code   Consults: Cardiology (telephone only); TOC team  DVT Prophylaxis: Lovenox  Family Communication: None present; patient declined to have me call family at the time of admission  Severity of Illness: The appropriate patient status for this patient is OBSERVATION. Observation status is judged to be reasonable and necessary in order to provide the required intensity of service to ensure the patient's safety. The patient's presenting symptoms, physical exam findings, and initial radiographic and laboratory data in the context of their medical condition is felt to place them at decreased risk for further clinical deterioration. Furthermore, it is anticipated that the patient will be medically stable for discharge from the hospital within 2 midnights of admission.   Author: Jonah Blue, MD 12/29/2022 3:55 PM  For on call review [email or web address].

## 2022-12-29 NOTE — ED Notes (Signed)
ED TO INPATIENT HANDOFF REPORT  ED Nurse Name and Phone #: Delorse Lek [phone]  S Name/Age/Gender Avel Sensor 32 y.o. male Room/Bed: 021C/021C  Code Status   Code Status: Full Code  Home/SNF/Other Home Patient oriented to: self, place, time, and situation Is this baseline? Yes   Triage Complete: Triage complete  Chief Complaint Accidental overdose, initial encounter [T50.901A]  Triage Note [place] via ems with a CC of overdose. Pt [place] the woods cold to the touch,  unresponsive. EMS gave .5 mg of narcan and pt became slight more responsive. Pt required to be ventilated in field, and was eventually transitioned to a NRB. Pt currently has a GCS of 10. Not maintaining airway    Allergies Not on File  Level of Care/Admitting Diagnosis ED Disposition     ED Disposition  Admit   Condition  --   Comment  Hospital Area: [facility] [[id number]]  Level of Care: Progressive [102]  Admit to Progressive based on following criteria: NEUROLOGICAL AND NEUROSURGICAL complex patients with significant risk of instability, who do not meet ICU criteria, yet require close observation or frequent assessment (< / = every 2 - 4 hours) with medical / nursing intervention.  Admit to Progressive based on following criteria: ACUTE MENTAL DISORDER-RELATED Drug/Alcohol Ingestion/Overdose/Withdrawal, Suicidal Ideation/attempt requiring safety sitter and < Q2h monitoring/assessments, moderate to severe agitation that is managed with medication/sitter, CIWA-Ar score < 20.  May place patient in observation at [facility] or [facility] if equivalent level of care is available:: Yes  Covid Evaluation: Asymptomatic - no recent exposure (last 10 days) testing not required  Diagnosis: Accidental overdose, initial encounter [147829]  Admitting Physician: Jonah Blue [[id number]]  Attending Physician: Jonah Blue [[id number]]          B Medical/Surgery  History Past Medical History:  Diagnosis Date   Difficulty controlling anger    Polysubstance abuse (HCC)    History reviewed. No pertinent surgical history.   A IV Location/Drains/Wounds Patient Lines/Drains/Airways Status     Active Line/Drains/Airways     Name Placement date Placement time Site Days   Peripheral IV 12/29/22 20 G Anterior;Proximal;Right Forearm 12/29/22  1148  Forearm  less than 1   Peripheral IV 12/29/22 20 G Left Antecubital 12/29/22  1150  Antecubital  less than 1            Intake/Output Last 24 hours  Intake/Output Summary (Last 24 hours) at 12/29/2022 1541 Last data filed at 12/29/2022 1400 Gross per 24 hour  Intake --  Output 600 ml  Net -600 ml    Labs/Imaging Results for orders placed or performed during the hospital encounter of 12/29/22 (from the past 48 hour(s))  I-Stat arterial blood gas, ED     Status: Abnormal   Collection Time: 12/29/22 11:55 AM  Result Value Ref Range   pH, Arterial 7.180 (LL) 7.35 - 7.45   pCO2 arterial 55.7 (H) 32 - 48 mmHg   pO2, Arterial 31 (LL) 83 - 108 mmHg   Bicarbonate 22.8 20.0 - 28.0 mmol/L   TCO2 25 22 - 32 mmol/L   O2 Saturation 67 %   Acid-base deficit 9.0 (H) 0.0 - 2.0 mmol/L   Sodium 142 135 - 145 mmol/L   Potassium 5.2 (H) 3.5 - 5.1 mmol/L   Calcium, Ion 1.20 1.15 - 1.40 mmol/L   HCT 57.0 (H) 39.0 - 52.0 %   Hemoglobin 19.4 (H) 13.0 - 17.0 g/dL  Patient temperature 87.0 F    Collection site RADIAL, ALLEN'S TEST ACCEPTABLE    Drawn by RT    Sample type ARTERIAL    Comment NOTIFIED PHYSICIAN   Comprehensive metabolic panel     Status: Abnormal   Collection Time: 12/29/22 12:30 PM  Result Value Ref Range   Sodium 137 135 - 145 mmol/L   Potassium 4.8 3.5 - 5.1 mmol/L   Chloride 102 98 - 111 mmol/L   CO2 20 (L) 22 - 32 mmol/L   Glucose, Bld 103 (H) 70 - 99 mg/dL    Comment: Glucose reference range applies only to samples taken after fasting for at least 8 hours.   BUN 28 (H) 6 - 20 mg/dL     Comment: QA FLAGS AND/OR RANGES MODIFIED BY DEMOGRAPHIC UPDATE ON 05/31 AT 1507   Creatinine, Ser 1.37 (H) 0.61 - 1.24 mg/dL   Calcium 8.4 (L) 8.9 - 10.3 mg/dL   Total Protein 7.8 6.5 - 8.1 g/dL   Albumin 4.2 3.5 - 5.0 g/dL   AST 81 (H) 15 - 41 U/L   ALT 40 0 - 44 U/L   Alkaline Phosphatase 66 38 - 126 U/L   Total Bilirubin 0.8 0.3 - 1.2 mg/dL   GFR, Estimated 34 (L) >60 mL/min    Comment: (NOTE) Calculated using the CKD-EPI Creatinine Equation (2021)    Anion gap 15 5 - 15    Comment: Performed at [facility], [address], [place], [place] [address]  CBC with Differential     Status: Abnormal   Collection Time: 12/29/22 12:30 PM  Result Value Ref Range   WBC 4.4 4.0 - 10.5 K/uL   RBC 5.50 4.22 - 5.81 MIL/uL   Hemoglobin 16.3 13.0 - 17.0 g/dL   HCT 60.4 (H) 54.0 - 98.1 %   MCV 98.2 80.0 - 100.0 fL   MCH 29.6 26.0 - 34.0 pg   MCHC 30.2 30.0 - 36.0 g/dL   RDW 19.1 47.8 - 29.5 %   Platelets 262 150 - 400 K/uL   nRBC 0.0 0.0 - 0.2 %   Neutrophils Relative % 83 %   Neutro Abs 3.7 1.7 - 7.7 K/uL   Lymphocytes Relative 12 %   Lymphs Abs 0.5 (L) 0.7 - 4.0 K/uL   Monocytes Relative 5 %   Monocytes Absolute 0.2 0.1 - 1.0 K/uL   Eosinophils Relative 0 %   Eosinophils Absolute 0.0 0.0 - 0.5 K/uL   Basophils Relative 0 %   Basophils Absolute 0.0 0.0 - 0.1 K/uL   Immature Granulocytes 0 %   Abs Immature Granulocytes 0.01 0.00 - 0.07 K/uL    Comment: Performed at [facility], [address] [place], [place] [address]  Brain natriuretic peptide     Status: Abnormal   Collection Time: 12/29/22 12:30 PM  Result Value Ref Range   B Natriuretic Peptide 427.0 (H) 0.0 - 100.0 pg/mL    Comment: Performed at [facility], [address], [place], [place] [address]  Salicylate level     Status: Abnormal   Collection Time: 12/29/22 12:30 PM  Result Value Ref Range   Salicylate Lvl <7.0 (L) 7.0 - 30.0 mg/dL    Comment: Performed at [facility], [address] [place], [place] [address]  Acetaminophen level     Status: Abnormal   Collection Time: 12/29/22 12:30 PM  Result Value Ref Range   Acetaminophen (Tylenol), Serum <10 (L) 10 -  30 ug/mL    Comment: (NOTE) Therapeutic concentrations vary significantly. A range of 10-30 ug/mL  may be an effective concentration for many patients. However, some  are best treated at concentrations outside of this range. Acetaminophen concentrations >150 ug/mL at 4 hours after ingestion  and >50 ug/mL at 12 hours after ingestion are often associated with  toxic reactions.  Performed at [facility], [address], [place], [place] [address]   Lactic acid, plasma     Status: Abnormal   Collection Time: 12/29/22 12:30 PM  Result Value Ref Range   Lactic Acid, Venous 4.2 (HH) 0.5 - 1.9 mmol/L    Comment: CRITICAL RESULT CALLED TO, READ BACK BY AND VERIFIED WITH Carolyne Littles, RN @ 1350 12/29/22 BY SEKDAHL Performed at [facility], [address] [place], [place] [address]   Ethanol     Status: None   Collection Time: 12/29/22 12:30 PM  Result Value Ref Range   Alcohol, Ethyl (B) <10 <10 mg/dL    Comment: (NOTE) Lowest detectable limit for serum alcohol is 10 mg/dL.  For medical purposes only. Performed at [facility], [address], [place], [place] [address]   Troponin I (High Sensitivity)     Status: Abnormal   Collection Time: 12/29/22 12:30 PM  Result Value Ref Range   Troponin I (High Sensitivity) 257 (HH) <18 ng/L    Comment: CRITICAL RESULT CALLED TO, READ BACK BY AND VERIFIED WITH K. Heliodoro Domagalski, RN @ 1350 12/29/22 BY SEKDAHL (NOTE) Elevated high sensitivity troponin I (hsTnI) values and significant  changes across serial measurements may suggest ACS but many other  chronic and acute conditions are known to elevate hsTnI results.  Refer to the "Links" section for chest pain algorithms and additional  guidance. Performed at [facility], [address] [place],  [place] [address]   Urine rapid drug screen (hosp performed)     Status: Abnormal   Collection Time: 12/29/22  1:03 PM  Result Value Ref Range   Opiates NONE DETECTED NONE DETECTED   Cocaine POSITIVE (A) NONE DETECTED   Benzodiazepines NONE DETECTED NONE DETECTED   Amphetamines NONE DETECTED NONE DETECTED   Tetrahydrocannabinol POSITIVE (A) NONE DETECTED   Barbiturates NONE DETECTED NONE DETECTED    Comment: (NOTE) DRUG SCREEN FOR MEDICAL PURPOSES ONLY.  IF CONFIRMATION IS NEEDED FOR ANY PURPOSE, NOTIFY LAB WITHIN 5 DAYS.  LOWEST DETECTABLE LIMITS FOR URINE DRUG SCREEN Drug Class                     Cutoff (ng/mL) Amphetamine and metabolites    1000 Barbiturate and metabolites    200 Benzodiazepine                 200 Opiates and metabolites        300 Cocaine and metabolites        300 THC                            50 Performed at [facility], [address], [place], [place] [address]   Lactic acid, plasma     Status: Abnormal   Collection Time: 12/29/22  1:39 PM  Result Value Ref Range   Lactic Acid, Venous 3.8 (HH) 0.5 - 1.9 mmol/L    Comment: CRITICAL VALUE NOTED. VALUE IS CONSISTENT WITH PREVIOUSLY REPORTED/CALLED VALUE Performed at [facility], [address], [place], [place] [address]   I-Stat arterial blood gas, ED [facility], [facility], [facility])     Status: Abnormal   Collection Time: 12/29/22  2:35 PM  Result Value Ref Range   pH, Arterial 7.179 (LL) 7.35 - 7.45   pCO2 arterial 57.0 (H) 32 - 48 mmHg   pO2, Arterial 151 (H) 83 - 108 mmHg   Bicarbonate 21.3 20.0 - 28.0 mmol/L   TCO2 23 22 - 32 mmol/L   O2 Saturation 99 %   Acid-base deficit 8.0 (H) 0.0 - 2.0 mmol/L   Sodium 140 135 - 145 mmol/L   Potassium 6.9 (HH) 3.5 - 5.1 mmol/L   Calcium, Ion 1.14 (L) 1.15 - 1.40 mmol/L   HCT 51.0 39.0 - 52.0 %   Hemoglobin 17.3 (H) 13.0 - 17.0 g/dL   Patient temperature 95.6 F    Collection site RADIAL, ALLEN'S TEST ACCEPTABLE    Drawn by RT    Sample type ARTERIAL     Comment NOTIFIED PHYSICIAN    CT Head Wo Contrast  Result Date: 12/29/2022 CLINICAL DATA:  Possible seizure, overdose. EXAM: CT HEAD WITHOUT CONTRAST CT CERVICAL SPINE WITHOUT CONTRAST TECHNIQUE: Multidetector CT imaging of the head and cervical spine was performed following the standard protocol without intravenous contrast. Multiplanar CT image reconstructions of the cervical spine were also generated. RADIATION DOSE REDUCTION: This exam was performed according to the departmental dose-optimization program which includes automated exposure control, adjustment of the mA and/or kV according to patient size and/or use of iterative reconstruction technique. COMPARISON:  None Available. FINDINGS: CT HEAD FINDINGS Brain: No acute intracranial hemorrhage. Gray-white differentiation is preserved. No hydrocephalus or extra-axial collection. No mass effect or midline shift. Vascular: No hyperdense vessel or unexpected calcification. Skull: No calvarial fracture or suspicious bone lesion. Skull base is unremarkable. Sinuses/Orbits: Unremarkable. Other: None. CT CERVICAL SPINE FINDINGS Alignment: Normal. Skull base and vertebrae: Mild motion degradation through the C5-6 level. Within this limitation, no acute fracture. Normal craniocervical junction. No suspicious bone lesions. Soft tissues and spinal canal: No prevertebral fluid or swelling. No visible canal hematoma. Disc levels:  No significant degenerative change. Upper chest: Patchy ground-glass opacities in the right-greater-than-left upper lobes may reflect aspiration or infection. Other: None. IMPRESSION: 1. No acute intracranial abnormality. 2. No acute cervical spine fracture. 3. Patchy ground-glass opacities in the right-greater-than-left upper lobes may reflect aspiration or infection. Electronically Signed   By: Orvan Falconer M.D.   On: 12/29/2022 14:33   CT Cervical Spine Wo Contrast  Result Date: 12/29/2022 CLINICAL DATA:  Possible seizure, overdose.  EXAM: CT HEAD WITHOUT CONTRAST CT CERVICAL SPINE WITHOUT CONTRAST TECHNIQUE: Multidetector CT imaging of the head and cervical spine was performed following the standard protocol without intravenous contrast. Multiplanar CT image reconstructions of the cervical spine were also generated. RADIATION DOSE REDUCTION: This exam was performed according to the departmental dose-optimization program which includes automated exposure control, adjustment of the mA and/or kV according to patient size and/or use of iterative reconstruction technique. COMPARISON:  None Available. FINDINGS: CT HEAD FINDINGS Brain: No acute intracranial hemorrhage. Gray-white differentiation is preserved. No hydrocephalus or extra-axial collection. No mass effect or midline shift. Vascular: No hyperdense vessel or unexpected calcification. Skull: No calvarial fracture or suspicious bone lesion. Skull base is unremarkable. Sinuses/Orbits: Unremarkable. Other: None. CT CERVICAL SPINE FINDINGS Alignment: Normal. Skull base and vertebrae: Mild motion degradation through the C5-6 level. Within this limitation, no acute fracture. Normal craniocervical junction. No suspicious bone lesions. Soft tissues and  spinal canal: No prevertebral fluid or swelling. No visible canal hematoma. Disc levels:  No significant degenerative change. Upper chest: Patchy ground-glass opacities in the right-greater-than-left upper lobes may reflect aspiration or infection. Other: None. IMPRESSION: 1. No acute intracranial abnormality. 2. No acute cervical spine fracture. 3. Patchy ground-glass opacities in the right-greater-than-left upper lobes may reflect aspiration or infection. Electronically Signed   By: Orvan Falconer M.D.   On: 12/29/2022 14:33   DG Chest Portable 1 View  Result Date: 12/29/2022 CLINICAL DATA:  Aspiration. EXAM: PORTABLE CHEST 1 VIEW COMPARISON:  None Available. FINDINGS: No pneumothorax, effusion or edema. Normal cardiopericardial silhouette.  Overlapping cardiac leads. There is right perihilar and lung base parenchymal opacity. Infiltrates possible. Recommend follow-up IMPRESSION: Right perihilar and medial lung base opacity. Possible infiltrate. Aspiration would be in the differential. Recommend follow-up. Electronically Signed   By: Karen Kays M.D.   On: 12/29/2022 12:59    Pending Labs Unresulted Labs (From admission, onward)     Start     Ordered   12/30/22 0500  Basic metabolic panel  Tomorrow morning,   R        12/29/22 1513   12/30/22 0500  CBC  Tomorrow morning,   R        12/29/22 1513   12/29/22 1600  HIV Antibody (routine testing w rflx)  Once,   R        12/29/22 1600   12/29/22 1338  CK  Add-on,   AD        12/29/22 1337            Vitals/Pain Today's Vitals   12/29/22 1259 12/29/22 1345 12/29/22 1432 12/29/22 1508  BP:  (!) 116/93    Pulse: 99 96  (!) 105  Resp: (!) 25 19  18   Temp:   97.7 F (36.5 C)   TempSrc:   Axillary   SpO2: 92% 98%  93%  Weight:      Height:        Isolation Precautions No active isolations  Medications Medications  enoxaparin (LOVENOX) injection 40 mg (has no administration in time range)  sodium chloride flush (NS) 0.9 % injection 3 mL (has no administration in time range)  lactated ringers infusion (has no administration in time range)  acetaminophen (TYLENOL) tablet 650 mg (has no administration in time range)    Or  acetaminophen (TYLENOL) suppository 650 mg (has no administration in time range)  polyethylene glycol (MIRALAX / GLYCOLAX) packet 17 g (has no administration in time range)  bisacodyl (DULCOLAX) EC tablet 5 mg (has no administration in time range)  ondansetron (ZOFRAN) tablet 4 mg (has no administration in time range)    Or  ondansetron (ZOFRAN) injection 4 mg (has no administration in time range)  hydrALAZINE (APRESOLINE) injection 5 mg (has no administration in time range)  Ampicillin-Sulbactam (UNASYN) 3 g in sodium chloride 0.9 % 100 mL IVPB  (has no administration in time range)  Ampicillin-Sulbactam (UNASYN) 3 g in sodium chloride 0.9 % 100 mL IVPB (0 g Intravenous Stopped 12/29/22 1250)  ondansetron (ZOFRAN) injection 4 mg (4 mg Intravenous Given 12/29/22 1158)  naloxone (NARCAN) injection 0.1 mg (0.1 mg Intravenous Given 12/29/22 1155)  levETIRAcetam (KEPPRA) IVPB 1000 mg/100 mL premix (0 mg Intravenous Stopped 12/29/22 1234)  sodium chloride 0.9 % bolus 1,000 mL (0 mLs Intravenous Stopped 12/29/22 1528)    Mobility walks     Focused Assessments Neuro Assessment Handoff:  Swallow screen pass? Yes  Neuro Assessment:   Neuro Checks:      Has TPA been given? No If patient is a Neuro Trauma and patient is going to OR before floor call report to 4N Charge nurse: [phone] or [phone]   R Recommendations: See Admitting Provider Note  Report given to:   Additional Notes:

## 2022-12-30 ENCOUNTER — Observation Stay (HOSPITAL_COMMUNITY): Payer: Medicaid Other

## 2022-12-30 DIAGNOSIS — J9601 Acute respiratory failure with hypoxia: Secondary | ICD-10-CM | POA: Diagnosis present

## 2022-12-30 DIAGNOSIS — E875 Hyperkalemia: Secondary | ICD-10-CM | POA: Diagnosis not present

## 2022-12-30 DIAGNOSIS — E860 Dehydration: Secondary | ICD-10-CM | POA: Diagnosis present

## 2022-12-30 DIAGNOSIS — I2489 Other forms of acute ischemic heart disease: Secondary | ICD-10-CM | POA: Diagnosis present

## 2022-12-30 DIAGNOSIS — Z5900 Homelessness unspecified: Secondary | ICD-10-CM | POA: Diagnosis not present

## 2022-12-30 DIAGNOSIS — T50901A Poisoning by unspecified drugs, medicaments and biological substances, accidental (unintentional), initial encounter: Secondary | ICD-10-CM | POA: Diagnosis not present

## 2022-12-30 DIAGNOSIS — M6282 Rhabdomyolysis: Secondary | ICD-10-CM | POA: Diagnosis not present

## 2022-12-30 DIAGNOSIS — J690 Pneumonitis due to inhalation of food and vomit: Secondary | ICD-10-CM | POA: Diagnosis present

## 2022-12-30 DIAGNOSIS — T40714A Poisoning by cannabis, undetermined, initial encounter: Secondary | ICD-10-CM | POA: Diagnosis present

## 2022-12-30 DIAGNOSIS — Y92524 Gas station as the place of occurrence of the external cause: Secondary | ICD-10-CM | POA: Diagnosis not present

## 2022-12-30 DIAGNOSIS — E872 Acidosis, unspecified: Secondary | ICD-10-CM | POA: Diagnosis present

## 2022-12-30 DIAGNOSIS — N179 Acute kidney failure, unspecified: Secondary | ICD-10-CM | POA: Diagnosis present

## 2022-12-30 DIAGNOSIS — F1210 Cannabis abuse, uncomplicated: Secondary | ICD-10-CM | POA: Diagnosis present

## 2022-12-30 DIAGNOSIS — G928 Other toxic encephalopathy: Secondary | ICD-10-CM | POA: Diagnosis present

## 2022-12-30 DIAGNOSIS — F1410 Cocaine abuse, uncomplicated: Secondary | ICD-10-CM | POA: Diagnosis present

## 2022-12-30 DIAGNOSIS — F1910 Other psychoactive substance abuse, uncomplicated: Secondary | ICD-10-CM | POA: Diagnosis present

## 2022-12-30 DIAGNOSIS — T405X1A Poisoning by cocaine, accidental (unintentional), initial encounter: Secondary | ICD-10-CM | POA: Diagnosis present

## 2022-12-30 LAB — HIV ANTIBODY (ROUTINE TESTING W REFLEX): HIV Screen 4th Generation wRfx: NONREACTIVE

## 2022-12-30 LAB — CBC
HCT: 50.9 % (ref 39.0–52.0)
Hemoglobin: 15.8 g/dL (ref 13.0–17.0)
MCH: 29.6 pg (ref 26.0–34.0)
MCHC: 31 g/dL (ref 30.0–36.0)
MCV: 95.5 fL (ref 80.0–100.0)
Platelets: 200 10*3/uL (ref 150–400)
RBC: 5.33 MIL/uL (ref 4.22–5.81)
RDW: 14.8 % (ref 11.5–15.5)
WBC: 6 10*3/uL (ref 4.0–10.5)
nRBC: 0 % (ref 0.0–0.2)

## 2022-12-30 LAB — BASIC METABOLIC PANEL
Anion gap: 12 (ref 5–15)
BUN: 18 mg/dL (ref 6–20)
CO2: 21 mmol/L — ABNORMAL LOW (ref 22–32)
Calcium: 8.6 mg/dL — ABNORMAL LOW (ref 8.9–10.3)
Chloride: 103 mmol/L (ref 98–111)
Creatinine, Ser: 1.43 mg/dL — ABNORMAL HIGH (ref 0.61–1.24)
GFR, Estimated: >60 mL/min (ref >60)
Glucose, Bld: 101 mg/dL — ABNORMAL HIGH (ref 70–99)
Potassium: 5.3 mmol/L — ABNORMAL HIGH (ref 3.5–5.1)
Sodium: 136 mmol/L (ref 135–145)

## 2022-12-30 MED ORDER — AMOXICILLIN-POT CLAVULANATE 875-125 MG PO TABS
1.0000 | ORAL_TABLET | Freq: Two times a day (BID) | ORAL | Status: DC
  Administered 2022-12-30 – 2022-12-31 (×3): 1 via ORAL
  Filled 2022-12-30 (×3): qty 1

## 2022-12-30 MED ORDER — SODIUM ZIRCONIUM CYCLOSILICATE 5 G PO PACK
5.0000 g | PACK | Freq: Once | ORAL | Status: AC
  Administered 2022-12-30: 5 g via ORAL
  Filled 2022-12-30: qty 1

## 2022-12-30 MED ORDER — METHOCARBAMOL 500 MG PO TABS
750.0000 mg | ORAL_TABLET | Freq: Three times a day (TID) | ORAL | Status: DC | PRN
  Administered 2022-12-30: 750 mg via ORAL
  Filled 2022-12-30: qty 2

## 2022-12-30 NOTE — Progress Notes (Signed)
Pt with decreased appetite - did not eat meals during day shift.  Mother at bedside and expressed concern.  Support and education provided.  Will continue to monitor.

## 2022-12-30 NOTE — Progress Notes (Signed)
Family/friends calling unit and requesting update on pt status.  Pt alert and oriented.  Pt requested for sister, Mark Harvey to be listed as main point of contact.  Pt okay with mother being listed as second point.  Pts room phone number given to sister so she can contact pt directly for updates.

## 2022-12-30 NOTE — Progress Notes (Signed)
PROGRESS NOTE  Mark Harvey  WUJ:[id number] DOB: [birth year] DOA: 12/29/2022 PCP: Pcp, No   Brief Narrative: Patient is a 32 year old male with no significant past medical history who presented with ingestion of unknown green substance and was found to be down at a gas station.  Was given CPR for responsiveness by a bystander.  EMS found him unresponsive,, hypoxic given Narcan.  There is report of snorting cocaine, marijuana intake.  Does have mouth/tongue trauma.  On presentation, he was in respiratory failure and required to be on BiPAP.  Chest imaging showed bilateral infiltrates consistent with aspiration, started on Unasyn.  He denied any suicidal intention or homicidal ideation.  The overdose was accidental. Mentation has improved, currently he is alert and oriented.  This morning he was on room air.  Plan for discharge to home tomorrow if remains stable.  Continue IV fluids for elevated CK  Assessment & Plan:  Principal Problem:   Accidental overdose, initial encounter Active Problems:   Seizure-like activity (HCC)   Aspiration into respiratory tract   Polysubstance abuse (HCC)   AKI (acute kidney injury) (HCC)   Accidental overdose/metabolic encephalopathy: Scenario  as above.  Now alert and oriented.  Denies suicidal intention/homicidal ideation.  CT head did not show any acute intracranial findings. UDS positive for cocaine, THC.  He did not tell what the green substance was. Suspected to have seizure, found to have tongue trauma.  No history of known seizures.  Given Keppra 1 dose.  This is likely associated with intoxication.  Will continue to hold antiepileptic drugs.  Acute hypoxic respiratory failure: Initially had to be put on BiPAP, on room air now.  Elevated CK/elevated lactate level: Rhabdomyolysis secondary to dehydration or could be from seizure from intoxication.  Continue IV fluids and monitor  Elevated troponin: This is likely from multifactorial etiology and  likely demand ischemia: Cocaine intake, hypoxia, CPR.  Denies any chest pain right now.  No further workup needed.  Aspiration pneumonia: Was given Unasyn initially.  Now off antibiotics  AKI/hyperkalemia: Likely in the setting of dehydration.  Continue IV fluids.  Continue monitor kidney function.  Given a dose of  Lokelma.  Alcohol use/tobacco use: Counseled for cessation       DVT prophylaxis:enoxaparin (LOVENOX) injection 40 mg Start: 12/29/22 1600     Code Status: Full Code  Family Communication: None at the bedside  Patient status:Obs  Patient is from :Home  Anticipated discharge NF:AOZH  Estimated DC date:1-2 days   Consultants: None  Procedures:None  Antimicrobials:  Anti-infectives (From admission, onward)    Start     Dose/Rate Route Frequency Ordered Stop   12/29/22 1545  Ampicillin-Sulbactam (UNASYN) 3 g in sodium chloride 0.9 % 100 mL IVPB  Status:  Discontinued        3 g 200 mL/hr over 30 Minutes Intravenous Every 6 hours 12/29/22 1539 12/29/22 1552   12/29/22 1145  Ampicillin-Sulbactam (UNASYN) 3 g in sodium chloride 0.9 % 100 mL IVPB        3 g 200 mL/hr over 30 Minutes Intravenous  Once 12/29/22 1132 12/29/22 1250       Subjective:  Patient seen and examined at bedside today.  Hemodynamically stable comfortable.  Lying in bed.  On room air.  Alert oriented.  Complains of soreness on the shoulders and legs.  Objective: Vitals:   12/29/22 1645 12/29/22 1939 12/29/22 2310 12/30/22 0300  BP: 103/68 94/70 119/79 109/85  Pulse: 86 85 83 78  Resp: 16 16  15 18  Temp: 98.2 F (36.8 C) 97.9 F (36.6 C) 97.9 F (36.6 C) 98 F (36.7 C)  TempSrc: Oral Oral Oral Oral  SpO2: 99% 97% 100% 100%  Weight:      Height:        Intake/Output Summary (Last 24 hours) at 12/30/2022 0812 Last data filed at 12/30/2022 0659 Gross per 24 hour  Intake 900 ml  Output 2200 ml  Net -1300 ml   Filed Weights   12/29/22 1141 12/29/22 1202  Weight: 81.6 kg 81.6 kg     Examination:  General exam: Overall comfortable, not in distress HEENT: PERRL Respiratory system:  no wheezes or crackles  Cardiovascular system: S1 & S2 heard, RRR.  Gastrointestinal system: Abdomen is nondistended, soft and nontender. Central nervous system: Alert and oriented Extremities: No edema, no clubbing ,no cyanosis Skin: No rashes, no ulcers,no icterus     Data Reviewed: I have personally reviewed following labs and imaging studies  CBC: Recent Labs  Lab 12/29/22 1155 12/29/22 1230 12/29/22 1435 12/30/22 0124  WBC  --  4.4  --  6.0  NEUTROABS  --  3.7  --   --   HGB 19.4* 16.3 17.3* 15.8  HCT 57.0* 54.0* 51.0 50.9  MCV  --  98.2  --  95.5  PLT  --  262  --  200   Basic Metabolic Panel: Recent Labs  Lab 12/29/22 1155 12/29/22 1230 12/29/22 1435 12/30/22 0124  NA 142 137 140 136  K 5.2* 4.8 6.9* 5.3*  CL  --  102  --  103  CO2  --  20*  --  21*  GLUCOSE  --  103*  --  101*  BUN  --  28*  --  18  CREATININE  --  1.37*  --  1.43*  CALCIUM  --  8.4*  --  8.6*     No results found for this or any previous visit (from the past 240 hour(s)).   Radiology Studies: CT Head Wo Contrast  Result Date: 12/29/2022 CLINICAL DATA:  Possible seizure, overdose. EXAM: CT HEAD WITHOUT CONTRAST CT CERVICAL SPINE WITHOUT CONTRAST TECHNIQUE: Multidetector CT imaging of the head and cervical spine was performed following the standard protocol without intravenous contrast. Multiplanar CT image reconstructions of the cervical spine were also generated. RADIATION DOSE REDUCTION: This exam was performed according to the departmental dose-optimization program which includes automated exposure control, adjustment of the mA and/or kV according to patient size and/or use of iterative reconstruction technique. COMPARISON:  None Available. FINDINGS: CT HEAD FINDINGS Brain: No acute intracranial hemorrhage. Gray-white differentiation is preserved. No hydrocephalus or extra-axial  collection. No mass effect or midline shift. Vascular: No hyperdense vessel or unexpected calcification. Skull: No calvarial fracture or suspicious bone lesion. Skull base is unremarkable. Sinuses/Orbits: Unremarkable. Other: None. CT CERVICAL SPINE FINDINGS Alignment: Normal. Skull base and vertebrae: Mild motion degradation through the C5-6 level. Within this limitation, no acute fracture. Normal craniocervical junction. No suspicious bone lesions. Soft tissues and spinal canal: No prevertebral fluid or swelling. No visible canal hematoma. Disc levels:  No significant degenerative change. Upper chest: Patchy ground-glass opacities in the right-greater-than-left upper lobes may reflect aspiration or infection. Other: None. IMPRESSION: 1. No acute intracranial abnormality. 2. No acute cervical spine fracture. 3. Patchy ground-glass opacities in the right-greater-than-left upper lobes may reflect aspiration or infection. Electronically Signed   By: Orvan Falconer M.D.   On: 12/29/2022 14:33   CT Cervical Spine Wo Contrast  Result Date: 12/29/2022 CLINICAL DATA:  Possible seizure, overdose. EXAM: CT HEAD WITHOUT CONTRAST CT CERVICAL SPINE WITHOUT CONTRAST TECHNIQUE: Multidetector CT imaging of the head and cervical spine was performed following the standard protocol without intravenous contrast. Multiplanar CT image reconstructions of the cervical spine were also generated. RADIATION DOSE REDUCTION: This exam was performed according to the departmental dose-optimization program which includes automated exposure control, adjustment of the mA and/or kV according to patient size and/or use of iterative reconstruction technique. COMPARISON:  None Available. FINDINGS: CT HEAD FINDINGS Brain: No acute intracranial hemorrhage. Gray-white differentiation is preserved. No hydrocephalus or extra-axial collection. No mass effect or midline shift. Vascular: No hyperdense vessel or unexpected calcification. Skull: No  calvarial fracture or suspicious bone lesion. Skull base is unremarkable. Sinuses/Orbits: Unremarkable. Other: None. CT CERVICAL SPINE FINDINGS Alignment: Normal. Skull base and vertebrae: Mild motion degradation through the C5-6 level. Within this limitation, no acute fracture. Normal craniocervical junction. No suspicious bone lesions. Soft tissues and spinal canal: No prevertebral fluid or swelling. No visible canal hematoma. Disc levels:  No significant degenerative change. Upper chest: Patchy ground-glass opacities in the right-greater-than-left upper lobes may reflect aspiration or infection. Other: None. IMPRESSION: 1. No acute intracranial abnormality. 2. No acute cervical spine fracture. 3. Patchy ground-glass opacities in the right-greater-than-left upper lobes may reflect aspiration or infection. Electronically Signed   By: Orvan Falconer M.D.   On: 12/29/2022 14:33   DG Chest Portable 1 View  Result Date: 12/29/2022 CLINICAL DATA:  Aspiration. EXAM: PORTABLE CHEST 1 VIEW COMPARISON:  None Available. FINDINGS: No pneumothorax, effusion or edema. Normal cardiopericardial silhouette. Overlapping cardiac leads. There is right perihilar and lung base parenchymal opacity. Infiltrates possible. Recommend follow-up IMPRESSION: Right perihilar and medial lung base opacity. Possible infiltrate. Aspiration would be in the differential. Recommend follow-up. Electronically Signed   By: Karen Kays M.D.   On: 12/29/2022 12:59    Scheduled Meds:  enoxaparin (LOVENOX) injection  40 mg Subcutaneous Q24H   sodium chloride flush  3 mL Intravenous Q12H   Continuous Infusions:  lactated ringers 75 mL/hr at 12/30/22 0659     LOS: 0 days   Burnadette Pop, MD [facility] P6/07/2022, 8:12 AM

## 2022-12-31 DIAGNOSIS — T50901A Poisoning by unspecified drugs, medicaments and biological substances, accidental (unintentional), initial encounter: Secondary | ICD-10-CM | POA: Diagnosis not present

## 2022-12-31 LAB — COMPREHENSIVE METABOLIC PANEL
ALT: 48 U/L — ABNORMAL HIGH (ref 0–44)
AST: 85 U/L — ABNORMAL HIGH (ref 15–41)
Albumin: 2.6 g/dL — ABNORMAL LOW (ref 3.5–5.0)
Alkaline Phosphatase: 40 U/L (ref 38–126)
Anion gap: 12 (ref 5–15)
BUN: 8 mg/dL (ref 6–20)
CO2: 23 mmol/L (ref 22–32)
Calcium: 8.5 mg/dL — ABNORMAL LOW (ref 8.9–10.3)
Chloride: 102 mmol/L (ref 98–111)
Creatinine, Ser: 1.17 mg/dL (ref 0.61–1.24)
GFR, Estimated: >60 mL/min (ref >60)
Glucose, Bld: 91 mg/dL (ref 70–99)
Potassium: 3.7 mmol/L (ref 3.5–5.1)
Sodium: 137 mmol/L (ref 135–145)
Total Bilirubin: 1 mg/dL (ref 0.3–1.2)
Total Protein: 5.3 g/dL — ABNORMAL LOW (ref 6.5–8.1)

## 2022-12-31 LAB — LACTIC ACID, PLASMA: Lactic Acid, Venous: 1.2 mmol/L (ref 0.5–1.9)

## 2022-12-31 LAB — CK: Total CK: 6528 U/L — ABNORMAL HIGH (ref 49–397)

## 2022-12-31 MED ORDER — AMOXICILLIN-POT CLAVULANATE 875-125 MG PO TABS: 1.0000 | ORAL_TABLET | Freq: Two times a day (BID) | ORAL | 0 refills | Status: AC

## 2022-12-31 MED ORDER — METHOCARBAMOL 750 MG PO TABS: 750.0000 mg | ORAL_TABLET | Freq: Three times a day (TID) | ORAL | 0 refills | Status: AC | PRN

## 2022-12-31 NOTE — Discharge Summary (Signed)
Physician Discharge Summary  Pleas Newlon ZOX:[id number] DOB: 1990-11-03 DOA: 12/29/2022  PCP: Pcp, No  Admit date: 12/29/2022 Discharge date: 12/31/2022  Admitted From: Home Disposition:  Home  Discharge Condition:Stable CODE STATUS:FULL Diet recommendation: Regular   Brief/Interim Summary: Patient is a 32 year old male with no significant past medical history who presented with ingestion of unknown green substance and was found to be down at a gas station.  Was given CPR for responsiveness by a bystander.  EMS found him unresponsive,, hypoxic given Narcan.  There is report of snorting cocaine, marijuana intake.  Does have mouth/tongue trauma.  On presentation, he was in respiratory failure and required to be on BiPAP.  Chest imaging showed bilateral infiltrates consistent with aspiration, started on Unasyn.  He denied any suicidal intention or homicidal ideation.  The overdose was accidental. Mentation has improved, currently he is alert and oriented.  This morning he was on room air and ambulated on the hallway.  CK level has trended down with IV fluid.  AKI has resolved.  Medically stable for discharge home today.  Following problems were addressed during the hospitalization:  Accidental overdose/metabolic encephalopathy: Scenario  as above.  Now alert and oriented.  Denies suicidal intention/homicidal ideation.  CT head did not show any acute intracranial findings. UDS positive for cocaine, THC.  He did not tell what the green substance was. Suspected to have seizure, found to have tongue trauma.  No history of known seizures.  Given Keppra 1 dose.  This is likely associated with intoxication.  Will continue to hold antiepileptic drugs.   Acute hypoxic respiratory failure: Initially had to be put on BiPAP, on room air now.   Elevated CK/elevated lactate level: Rhabdomyolysis secondary to dehydration or could be from seizure from intoxication.  Improved with IV fluids.  Elevated  troponin: This is likely from multifactorial etiology and likely demand ischemia: Cocaine intake, hypoxia, CPR.  Denies any chest pain right now.  No further workup needed.   Aspiration pneumonia: Was given Unasyn initially. Changed to augmentin   AKI/hyperkalemia: Likely in the setting of dehydration.  Resolved with IV fluid  Alcohol use/tobacco use: Counseled for cessation Discharge Diagnoses:  Principal Problem:   Accidental overdose, initial encounter Active Problems:   Seizure-like activity (HCC)   Aspiration into respiratory tract   Polysubstance abuse (HCC)   AKI (acute kidney injury) [facility])    Discharge Instructions  Discharge Instructions     Diet general   Complete by: As directed    Discharge instructions   Complete by: As directed    1)Please take prescribed medications as instructed 2)Please stop substance abuse ,alcohol and tobacco 3)Take plenty of fluids for next few days   Increase activity slowly   Complete by: As directed       Allergies as of 12/31/2022   Not on File      Medication List     TAKE these medications    amoxicillin-clavulanate 875-125 MG tablet Commonly known as: AUGMENTIN Take 1 tablet by mouth every 12 (twelve) hours for 4 days.   methocarbamol 750 MG tablet Commonly known as: ROBAXIN Take 1 tablet (750 mg total) by mouth every 8 (eight) hours as needed for muscle spasms.        Not on File  Consultations:    Procedures/Studies: DG CHEST PORT 1 VIEW  Result Date: 12/30/2022 CLINICAL DATA:  141880 SOB (shortness of breath) [id number] EXAM: PORTABLE CHEST - 1 VIEW COMPARISON:  12/29/2022 FINDINGS: Right perihilar airspace opacities are slightly  less conspicuous. The patchy right lower lung airspace disease is improved. Left lung remains clear. Heart size and mediastinal contours are within normal limits. No effusion. Visualized bones unremarkable. IMPRESSION: Improving right lung airspace disease. Electronically Signed   By: Corlis Leak M.D.   On: 12/30/2022 09:59   CT Head Wo Contrast  Result Date: 12/29/2022 CLINICAL DATA:  Possible seizure, overdose. EXAM: CT HEAD WITHOUT CONTRAST CT CERVICAL SPINE WITHOUT CONTRAST TECHNIQUE: Multidetector CT imaging of the head and cervical spine was performed following the standard protocol without intravenous contrast. Multiplanar CT image reconstructions of the cervical spine were also generated. RADIATION DOSE REDUCTION: This exam was performed according to the departmental dose-optimization program which includes automated exposure control, adjustment of the mA and/or kV according to patient size and/or use of iterative reconstruction technique. COMPARISON:  None Available. FINDINGS: CT HEAD FINDINGS Brain: No acute intracranial hemorrhage. Gray-white differentiation is preserved. No hydrocephalus or extra-axial collection. No mass effect or midline shift. Vascular: No hyperdense vessel or unexpected calcification. Skull: No calvarial fracture or suspicious bone lesion. Skull base is unremarkable. Sinuses/Orbits: Unremarkable. Other: None. CT CERVICAL SPINE FINDINGS Alignment: Normal. Skull base and vertebrae: Mild motion degradation through the C5-6 level. Within this limitation, no acute fracture. Normal craniocervical junction. No suspicious bone lesions. Soft tissues and spinal canal: No prevertebral fluid or swelling. No visible canal hematoma. Disc levels:  No significant degenerative change. Upper chest: Patchy ground-glass opacities in the right-greater-than-left upper lobes may reflect aspiration or infection. Other: None. IMPRESSION: 1. No acute intracranial abnormality. 2. No acute cervical spine fracture. 3. Patchy ground-glass opacities in the right-greater-than-left upper lobes may reflect aspiration or infection. Electronically Signed   By: Orvan Falconer M.D.   On: 12/29/2022 14:33   CT Cervical Spine Wo Contrast  Result Date: 12/29/2022 CLINICAL DATA:  Possible seizure,  overdose. EXAM: CT HEAD WITHOUT CONTRAST CT CERVICAL SPINE WITHOUT CONTRAST TECHNIQUE: Multidetector CT imaging of the head and cervical spine was performed following the standard protocol without intravenous contrast. Multiplanar CT image reconstructions of the cervical spine were also generated. RADIATION DOSE REDUCTION: This exam was performed according to the departmental dose-optimization program which includes automated exposure control, adjustment of the mA and/or kV according to patient size and/or use of iterative reconstruction technique. COMPARISON:  None Available. FINDINGS: CT HEAD FINDINGS Brain: No acute intracranial hemorrhage. Gray-white differentiation is preserved. No hydrocephalus or extra-axial collection. No mass effect or midline shift. Vascular: No hyperdense vessel or unexpected calcification. Skull: No calvarial fracture or suspicious bone lesion. Skull base is unremarkable. Sinuses/Orbits: Unremarkable. Other: None. CT CERVICAL SPINE FINDINGS Alignment: Normal. Skull base and vertebrae: Mild motion degradation through the C5-6 level. Within this limitation, no acute fracture. Normal craniocervical junction. No suspicious bone lesions. Soft tissues and spinal canal: No prevertebral fluid or swelling. No visible canal hematoma. Disc levels:  No significant degenerative change. Upper chest: Patchy ground-glass opacities in the right-greater-than-left upper lobes may reflect aspiration or infection. Other: None. IMPRESSION: 1. No acute intracranial abnormality. 2. No acute cervical spine fracture. 3. Patchy ground-glass opacities in the right-greater-than-left upper lobes may reflect aspiration or infection. Electronically Signed   By: Orvan Falconer M.D.   On: 12/29/2022 14:33   DG Chest Portable 1 View  Result Date: 12/29/2022 CLINICAL DATA:  Aspiration. EXAM: PORTABLE CHEST 1 VIEW COMPARISON:  None Available. FINDINGS: No pneumothorax, effusion or edema. Normal cardiopericardial  silhouette. Overlapping cardiac leads. There is right perihilar and lung base parenchymal opacity. Infiltrates possible. Recommend follow-up  IMPRESSION: Right perihilar and medial lung base opacity. Possible infiltrate. Aspiration would be in the differential. Recommend follow-up. Electronically Signed   By: Karen Kays M.D.   On: 12/29/2022 12:59      Subjective: Patient seen and examined at bedside today.  Hemodynamically stable.  Ambulated in the hallway.  Remains on room air.  Denies any new complaints.  Medically stable for discharge home today  Discharge Exam: Vitals:   12/30/22 1958 12/31/22 0930  BP: 132/75 (!) 127/92  Pulse:  (!) 56  Resp: 20 18  Temp: 98.5 F (36.9 C) 98.1 F (36.7 C)  SpO2: 100% 100%   Vitals:   12/30/22 1209 12/30/22 1620 12/30/22 1958 12/31/22 0930  BP: (!) 124/90 121/83 132/75 (!) 127/92  Pulse: 89 64  (!) 56  Resp:   20 18  Temp: 98.6 F (37 C) 98.9 F (37.2 C) 98.5 F (36.9 C) 98.1 F (36.7 C)  TempSrc: Oral Oral Oral Oral  SpO2: 97% 92% 100% 100%  Weight:      Height:        General: Pt is alert, awake, not in acute distress Cardiovascular: RRR, S1/S2 +, no rubs, no gallops Respiratory: CTA bilaterally, no wheezing, no rhonchi Abdominal: Soft, NT, ND, bowel sounds + Extremities: no edema, no cyanosis    The results of significant diagnostics from this hospitalization (including imaging, microbiology, ancillary and laboratory) are listed below for reference.     Microbiology: No results found for this or any previous visit (from the past 240 hour(s)).   Labs: BNP (last 3 results) Recent Labs    12/29/22 1230  BNP 427.0*   Basic Metabolic Panel: Recent Labs  Lab 12/29/22 1155 12/29/22 1230 12/29/22 1435 12/30/22 0124 12/31/22 0147  NA 142 137 140 136 137  K 5.2* 4.8 6.9* 5.3* 3.7  CL  --  102  --  103 102  CO2  --  20*  --  21* 23  GLUCOSE  --  103*  --  101* 91  BUN  --  28*  --  18 8  CREATININE  --  1.37*  --   1.43* 1.17  CALCIUM  --  8.4*  --  8.6* 8.5*   Liver Function Tests: Recent Labs  Lab 12/29/22 1230 12/31/22 0147  AST 81* 85*  ALT 40 48*  ALKPHOS 66 40  BILITOT 0.8 1.0  PROT 7.8 5.3*  ALBUMIN 4.2 2.6*   No results for input(s): "LIPASE", "AMYLASE" in the last 168 hours. No results for input(s): "AMMONIA" in the last 168 hours. CBC: Recent Labs  Lab 12/29/22 1155 12/29/22 1230 12/29/22 1435 12/30/22 0124  WBC  --  4.4  --  6.0  NEUTROABS  --  3.7  --   --   HGB 19.4* 16.3 17.3* 15.8  HCT 57.0* 54.0* 51.0 50.9  MCV  --  98.2  --  95.5  PLT  --  262  --  200   Cardiac Enzymes: Recent Labs  Lab 12/29/22 1525 12/31/22 0147  CKTOTAL 9,349* 6,528*   BNP: Invalid input(s): "POCBNP" CBG: No results for input(s): "GLUCAP" in the last 168 hours. D-Dimer No results for input(s): "DDIMER" in the last 72 hours. Hgb A1c No results for input(s): "HGBA1C" in the last 72 hours. Lipid Profile No results for input(s): "CHOL", "HDL", "LDLCALC", "TRIG", "CHOLHDL", "LDLDIRECT" in the last 72 hours. Thyroid function studies No results for input(s): "TSH", "T4TOTAL", "T3FREE", "THYROIDAB" in the last 72 hours.  Invalid input(s): "  FREET3" Anemia work up No results for input(s): "VITAMINB12", "FOLATE", "FERRITIN", "TIBC", "IRON", "RETICCTPCT" in the last 72 hours. Urinalysis No results found for: "COLORURINE", "APPEARANCEUR", "LABSPEC", "PHURINE", "GLUCOSEU", "HGBUR", "BILIRUBINUR", "KETONESUR", "PROTEINUR", "UROBILINOGEN", "NITRITE", "LEUKOCYTESUR" Sepsis Labs Recent Labs  Lab 12/29/22 1230 12/30/22 0124  WBC 4.4 6.0   Microbiology No results found for this or any previous visit (from the past 240 hour(s)).  Please note: You were cared for by a hospitalist during your hospital stay. Once you are discharged, your primary care physician will handle any further medical issues. Please note that NO REFILLS for any discharge medications will be authorized once you are  discharged, as it is imperative that you return to your primary care physician (or establish a relationship with a primary care physician if you do not have one) for your post hospital discharge needs so that they can reassess your need for medications and monitor your lab values.    Time coordinating discharge: 40 minutes  SIGNED:   Burnadette Pop, MD  [facility] 12/31/2022, 10:09 AM Pager [phone]  If 7PM-7AM, please contact night-coverage [email or web address] Password [email or web address]

## 2022-12-31 NOTE — Plan of Care (Signed)

## 2022-12-31 NOTE — Evaluation (Signed)
Physical Therapy Evaluation and D/C Patient Details Name: Mark Harvey MRN: [id number] DOB: [birth year] Today's Date: 12/31/2022  History of Present Illness  Mark Harvey is a 32 y.o. male brought in by EMS  for drug overdose.  He was found with a baggy of greenish powdery material unknown usage but suspected IV drug use.  Positive for cocaine and THC as well. CPR was initiated and possible seizure as he had bit his tongue. Pt placed on Bipap and had metabolic encephalopathy. PMH:  polysubstance abuse  Clinical Impression  Pt admitted with above diagnosis.  Pt currently without significant functional limitations and walking without device, without assist and no LOB with challenges.  Pt is not at baseline per pt however no safety issues with mobility. Will sign off.     Recommendations for follow up therapy are one component of a multi-disciplinary discharge planning process, led by the attending physician.  Recommendations may be updated based on patient status, additional functional criteria and insurance authorization.  Follow Up Recommendations       Assistance Recommended at Discharge None  Patient can return home with the following       Equipment Recommendations None recommended by PT  Recommendations for Other Services       Functional Status Assessment Patient has not had a recent decline in their functional status     Precautions / Restrictions Precautions Precautions: None Restrictions Weight Bearing Restrictions: No      Mobility  Bed Mobility Overal bed mobility: Independent                  Transfers Overall transfer level: Independent                      Ambulation/Gait Ambulation/Gait assistance: Independent Gait Distance (Feet): 400 Feet Assistive device: None Gait Pattern/deviations: WFL(Within Functional Limits)   Gait velocity interpretation: >2.62 ft/sec, indicative of community ambulatory   General Gait Details: No issues  with gait today with pt able to walk down the hall with no assist and no LOB with challenges.  Stairs            Wheelchair Mobility    Modified Rankin (Stroke Patients Only)       Balance Overall balance assessment: Independent                                           Pertinent Vitals/Pain Pain Assessment Pain Assessment: No/denies pain    Home Living Family/patient expects to be discharged to:: Private residence Living Arrangements: Non-relatives/Friends Available Help at Discharge: Friend(s);Available PRN/intermittently Type of Home: Apartment Home Access: Level entry       Home Layout: One level Home Equipment: None Additional Comments: Landscaping work    Prior Function Prior Level of Function : Independent/Modified Independent;Working/employed;Driving                     Hand Dominance   Dominant Hand: Right    Extremity/Trunk Assessment   Upper Extremity Assessment Upper Extremity Assessment: Overall WFL for tasks assessed    Lower Extremity Assessment Lower Extremity Assessment: Overall WFL for tasks assessed    Cervical / Trunk Assessment Cervical / Trunk Assessment: Normal  Communication   Communication: No difficulties  Cognition Arousal/Alertness: Awake/alert Behavior During Therapy: WFL for tasks assessed/performed Overall Cognitive Status: Within Functional Limits for tasks assessed  General Comments General comments (skin integrity, edema, etc.): 60 bpm    Exercises     Assessment/Plan    PT Assessment Patient does not need any further PT services  PT Problem List         PT Treatment Interventions      PT Goals (Current goals can be found in the Care Plan section)  Acute Rehab PT Goals Patient Stated Goal: D/C home today PT Goal Formulation: All assessment and education complete, DC therapy    Frequency       Co-evaluation                AM-PAC PT "6 Clicks" Mobility  Outcome Measure Help needed turning from your back to your side while in a flat bed without using bedrails?: None Help needed moving from lying on your back to sitting on the side of a flat bed without using bedrails?: None Help needed moving to and from a bed to a chair (including a wheelchair)?: None Help needed standing up from a chair using your arms (e.g., wheelchair or bedside chair)?: None Help needed to walk in hospital room?: None Help needed climbing 3-5 steps with a railing? : None 6 Click Score: 24    End of Session   Activity Tolerance: Patient tolerated treatment well Patient left: in bed;with call bell/phone within reach Nurse Communication: Mobility status PT Visit Diagnosis: Muscle weakness (generalized) (M62.81)    Time: 1610-9604 PT Time Calculation (min) (ACUTE ONLY): 13 min   Charges:   PT Evaluation $PT Eval Low Complexity: 1 Low          Zea Kostka M,PT Acute Rehab Services [phone]   Bevelyn Buckles 12/31/2022, 11:06 AM

## 2022-12-31 NOTE — Progress Notes (Signed)
Order to discharge pt home.  Discharge instructions/AVS given to patient and reviewed - education provided as needed.  Pt advised to call PCP and/or come back to the hospital if there are any problems. Pt verbalized understanding.    Pt waiting on family to bring clothes and transport pt home.

## 2023-01-01 ENCOUNTER — Encounter (HOSPITAL_COMMUNITY): Payer: Self-pay | Admitting: Registered Nurse
